# Patient Record
Sex: Female | Born: 1965 | Race: White | Hispanic: No | Smoking: Former smoker
Health system: Southern US, Community
[De-identification: ages and names within clinical notes are randomized; demographics above are authoritative.]

## PROBLEM LIST (undated history)

## (undated) DIAGNOSIS — Q2521 Interruption of aortic arch: Secondary | ICD-10-CM

## (undated) DIAGNOSIS — M810 Age-related osteoporosis without current pathological fracture: Secondary | ICD-10-CM

## (undated) HISTORY — PX: WISDOM TOOTH EXTRACTION: SHX21

## (undated) HISTORY — DX: Age-related osteoporosis without current pathological fracture: M81.0

## (undated) HISTORY — DX: Interruption of aortic arch: Q25.21

---
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
## 2001-10-03 ENCOUNTER — Other Ambulatory Visit: Admission: RE | Admit: 2001-10-03 | Discharge: 2001-10-03 | Payer: Self-pay | Admitting: *Deleted

## 2001-10-27 ENCOUNTER — Inpatient Hospital Stay (HOSPITAL_COMMUNITY): Admission: EM | Admit: 2001-10-27 | Discharge: 2001-10-29 | Payer: Self-pay | Admitting: Emergency Medicine

## 2002-10-12 ENCOUNTER — Other Ambulatory Visit: Admission: RE | Admit: 2002-10-12 | Discharge: 2002-10-12 | Payer: Self-pay | Admitting: Obstetrics and Gynecology

## 2003-10-13 ENCOUNTER — Other Ambulatory Visit: Admission: RE | Admit: 2003-10-13 | Discharge: 2003-10-13 | Payer: Self-pay | Admitting: Gynecology

## 2005-03-08 ENCOUNTER — Other Ambulatory Visit: Admission: RE | Admit: 2005-03-08 | Discharge: 2005-03-08 | Payer: Self-pay | Admitting: Gynecology

## 2006-05-15 ENCOUNTER — Other Ambulatory Visit: Admission: RE | Admit: 2006-05-15 | Discharge: 2006-05-15 | Payer: Self-pay | Admitting: Gynecology

## 2007-05-19 ENCOUNTER — Other Ambulatory Visit: Admission: RE | Admit: 2007-05-19 | Discharge: 2007-05-19 | Payer: Self-pay | Admitting: Gynecology

## 2008-07-28 ENCOUNTER — Other Ambulatory Visit: Admission: RE | Admit: 2008-07-28 | Discharge: 2008-07-28 | Payer: Self-pay | Admitting: Gynecology

## 2008-07-28 ENCOUNTER — Ambulatory Visit: Payer: Self-pay | Admitting: Women's Health

## 2008-07-28 ENCOUNTER — Encounter: Payer: Self-pay | Admitting: Women's Health

## 2010-02-10 ENCOUNTER — Other Ambulatory Visit: Admission: RE | Admit: 2010-02-10 | Discharge: 2010-02-10 | Payer: Self-pay | Admitting: Gynecology

## 2010-02-10 ENCOUNTER — Ambulatory Visit: Payer: Self-pay | Admitting: Women's Health

## 2010-10-20 NOTE — H&P (Signed)
[facility]  Patient:    Andrea Schroeder, Andrea Schroeder Visit Number: [id number] MRN: [id number]          Service Type: MED Location: 3W [id number] 01 Attending Physician:  Gaspar Garbe Dictated by:   Gaspar Garbe, M.D. Admit Date:  10/27/2001                           History and Physical  PRIMARY PHYSICIAN:  Unassigned.  CHIEF COMPLAINT:  Suicide attempt.  HISTORY OF PRESENT ILLNESS:  The patient is a 45 year old white female with no significant medical history who was at home today and found by her boyfriend to have attempted to overdose by taking approximately 40 pills of Tylenol PM. She had taken them a half-an-hour prior to being discovered.  The boyfriend noted that she was somewhat tired-appearing and found an empty pill bottle on the counter.  The patient did not make any attempt to notify him of her attempt at the time of the attempt.  He called Poison Control and then brought her to the emergency room where she was charcoal lavaged.  No pill fragments were found and given a loading dose of acetylcysteine after having an elevated Tylenol level.  The patient notes that she attempted this "so I would forget everything" and indicates that the time that she ingested the pills was just after 12 noon on Oct 27, 2001.  She indicates that she had a prior attempt six years ago where she tried to use alcohol to commit suicide.  The patient has seen a psychologist in the past, but never a psychiatrist.  This was for a diagnosis of ADD, during which time, she was given Wellbutrin as well as Ritalin.  She has never seen that practitioner other than that one time and did not continue to take her medication.  The boyfriend is in attendance and has indicated that family members will be with her 24 hours a day during her hospital stay.  The patient does not note any desire to make any other further attempt at this point, but does not show outward remorse for having  attempted to do so.  ALLERGIES:  No known drug allergies.  MEDICATIONS:  None.  PAST MEDICAL HISTORY: 1. 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  It may be necessary to    note that if she requires an NG tube and the tube seems to follow a    different path, that she may have full situs inversus. 2 ADD, diagnosed x1, but never pursued.  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  SOCIAL HISTORY:  The patient lives in [place] by herself.  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  Is single without children.  Nonsmoker and rarely a drinker.  Denies any drugs or herbal medicine use and does not indicate that she is currently using [employer] at the time.  FAMILY HISTORY:  Mother is alive and has a history of "racing heart beat." Father died possibly of lung cancer and the patient does not seem to know particulars.  She has two sisters, one with a history of depression who is not on medication.  She notes no other psychological or psychiatric diagnoses within her family.  REVIEW OF SYSTEMS:  The patient with some nausea following  administration of charcoal and the first dose of Mucomyst.  Denies any other complaints other than some fatigue and her obvious depression.  Other systems are negative.  ADVANCE DIRECTIVE:  The patient is a full code.  PHYSICAL EXAMINATION:  VITAL SIGNS:  Temperature 99.0, pulse 101, respiratory rate 20, and blood pressure 102/67.  Pain is 0 out of 10.  She is saturating 100% on room air.  GENERAL:  In no acute distress.  HEENT:  Normocephalic and atraumatic.  PERRLA.  EOMI.  ENT is within normal limits except for purplish discoloration secondary to charcoal around her lips.  NECK:  Supple, no lymphadenopathy, JVD, or bruit.  HEART:  Regular rate and rhythm.  No murmur, rub, or  gallop.  Pulses are 2+ bilaterally.  LUNGS:  Clear to auscultation bilaterally.  ABDOMEN:  Soft and nontender, normoactive bowel sounds.  No hepatosplenomegaly.  EXTREMITIES:  No clubbing, cyanosis, or edema or rashes are noted.  NEUROLOGIC:  Oriented x3.  Cranial nerves II-XII are intact.  She has 1+ deep tendon reflexes and strength 5/5 bilaterally.  LABORATORY DATA:  EKG showed initial rate of 123 with a P-R of 150, QRS of 92, and a QTC at 458.  She has slightly flattened Ts throughout the precordial leads, but no acute changes are noted.  Labs with white count 10.6, hemoglobin 12.9, hematocrit 37.7, platelets 485. BUN and creatinine are 16 and 1.0.  Her AST is 21, ALT 17, alkaline phosphatase 28.  There is no evidence of anion gap.  Tylenol level is 154.2. PT of 12.9 with an INR of 0.9 and a PTT 27, all normal.  She had a salicylate level which is negative and a tox-screen which tested positive for tricyclics which the patient vehemently denied.  ASSESSMENT: 1. Tylenol overdose.  The patient took 40 tablets of Tylenol PM.  She was    given 140 g/kg of Mucomyst in the emergency room and we will continue this    at 70 g/kg q.4h. for at least a 24 hour period, at which time, after which    we will check a Tylenol level, coags, and liver function tests.  If these    are considered normal, then the patient is medically cleared for Tylenol    overdose. 2. Prolonged QT.  Question if any changes in the EKG are related to the    antihistamine effect of the PM portion of Tylenol PM, which is very much    like Benadryl.  Will continue to watch her on telemetry and repeat an EKG    in the morning.  The lab indicated that tricyclics and these type of    medications do not cross react.  However, it is a holiday, and they did not    seem to want to look up the information to be certain.  I will make a    further attempt with the laboratory during normal business hours tomorrow. 3. Suicidal  ideation.  The patient has obviously attempted suicide and does     not show immediate remorse.  I called the Behavioral Health assessment team    and asked that she be assessed tonight.  The message was passed on to    Dr. Jeanie Sewer who will be handling her from a psychiatric nature with the    option being commitment, as I am not certain whether the patient would    want to stay for the duration of her treatment which could obviously cause    her  to further harm herself.  She may require commitment because of the    above.  We will follow their lead on psychiatric management and turn over    her care to them once she has been cleared from a medical standpoint. Dictated by:   Gaspar Garbe, M.D. Attending Physician:  Gaspar Garbe DD:  10/27/01 TD:  10/29/01 Job: [id number]

## 2010-10-20 NOTE — Discharge Summary (Signed)
[facility]  Patient:    Andrea Schroeder, EARLY Visit Number: [id number] MRN: [id number]          Service Type: MED Location: 3W 0364 01 Attending Physician:  Gaspar Garbe Dictated by:   Gaspar Garbe, M.D. Admit Date:  10/27/2001 Discharge Date: 10/29/2001                             Discharge Summary  DISCHARGE DIAGNOSES: 1. Tylenol ingestion, acute. 2. Suicidal ideation. 3. Positive tricyclic screen.  HOSPITAL COURSE:  The patient was admitted on Oct 27, 2001, from the emergency room after having taking approximately 40 Tylenol P.M.  She underwent charcoal lavage with no pill fragments being found and was started on Mucomyst with a loading dose in the emergency room.  She was followed for a period of greater than 24 hours after which her coagulations were negative.  Her Tylenol level was less than 10 and her liver function tests were found to be normal.  She was receiving Mucomyst every four hours.  The patient was seen by psychiatry and was found to be not committable.  She was offered a day program for her suicidal ideation at [facility] with an appointment at 1 p.m. on Oct 29, 2001.  The patient indicated that she was not going to every try this again and was grateful for the help given her.  DISCHARGE MEDICATIONS:  None.  FOLLOWUP:  Follow up as above. Dictated by:   Gaspar Garbe, M.D. Attending Physician:  Gaspar Garbe DD:  10/30/01 TD:  11/01/01 Job: [id number]

## 2010-11-13 ENCOUNTER — Emergency Department (HOSPITAL_COMMUNITY)
Admission: EM | Admit: 2010-11-13 | Discharge: 2010-11-13 | Disposition: A | Discharge status: Home / Self Care | Payer: 59 | Attending: Emergency Medicine | Admitting: Emergency Medicine

## 2010-11-13 ENCOUNTER — Emergency Department (HOSPITAL_COMMUNITY): Payer: 59

## 2010-11-13 DIAGNOSIS — R51 Headache: Secondary | ICD-10-CM | POA: Insufficient documentation

## 2010-11-13 DIAGNOSIS — R55 Syncope and collapse: Secondary | ICD-10-CM | POA: Insufficient documentation

## 2010-11-13 LAB — CBC
Hemoglobin: 12.8 g/dL (ref 12.0–15.0)
MCH: 29.6 pg (ref 26.0–34.0)
Platelets: 440 10*3/uL — ABNORMAL HIGH (ref 150–400)
RBC: 4.32 MIL/uL (ref 3.87–5.11)
WBC: 16.3 10*3/uL — ABNORMAL HIGH (ref 4.0–10.5)

## 2010-11-13 LAB — URINE MICROSCOPIC-ADD ON

## 2010-11-13 LAB — BASIC METABOLIC PANEL
CO2: 25 mEq/L (ref 19–32)
Calcium: 9 mg/dL (ref 8.4–10.5)
Chloride: 97 mEq/L (ref 96–112)
Glucose, Bld: 98 mg/dL (ref 70–99)
Sodium: 132 mEq/L — ABNORMAL LOW (ref 135–145)

## 2010-11-13 LAB — URINALYSIS, ROUTINE W REFLEX MICROSCOPIC
Ketones, ur: NEGATIVE mg/dL
Leukocytes, UA: NEGATIVE
Protein, ur: 30 mg/dL — AB
Urobilinogen, UA: 0.2 mg/dL (ref 0.0–1.0)

## 2010-11-13 LAB — DIFFERENTIAL
Basophils Relative: 0 % (ref 0–1)
Lymphs Abs: 2 10*3/uL (ref 0.7–4.0)
Monocytes Relative: 8 % (ref 3–12)
Neutro Abs: 12.9 10*3/uL — ABNORMAL HIGH (ref 1.7–7.7)
Neutrophils Relative %: 79 % — ABNORMAL HIGH (ref 43–77)

## 2010-11-13 LAB — D-DIMER, QUANTITATIVE: D-Dimer, Quant: <0.22 ug/mL-FEU (ref 0.00–0.48)

## 2010-11-13 LAB — RAPID URINE DRUG SCREEN, HOSP PERFORMED
Amphetamines: NOT DETECTED
Barbiturates: NOT DETECTED
Tetrahydrocannabinol: NOT DETECTED

## 2011-02-08 DIAGNOSIS — Q2521 Interruption of aortic arch: Secondary | ICD-10-CM | POA: Insufficient documentation

## 2011-02-08 DIAGNOSIS — B2790 Infectious mononucleosis, unspecified without complication: Secondary | ICD-10-CM | POA: Insufficient documentation

## 2011-02-15 ENCOUNTER — Encounter: Payer: Self-pay | Admitting: Women's Health

## 2011-02-15 ENCOUNTER — Ambulatory Visit (INDEPENDENT_AMBULATORY_CARE_PROVIDER_SITE_OTHER): Payer: 59 | Admitting: Women's Health

## 2011-02-15 ENCOUNTER — Other Ambulatory Visit (HOSPITAL_COMMUNITY)
Admission: RE | Admit: 2011-02-15 | Discharge: 2011-02-15 | Disposition: A | Discharge status: Home / Self Care | Payer: 59 | Source: Ambulatory Visit | Attending: Obstetrics and Gynecology | Admitting: Obstetrics and Gynecology

## 2011-02-15 VITALS — BP 112/70 | Ht 65.5 in | Wt 167.0 lb

## 2011-02-15 DIAGNOSIS — Z01419 Encounter for gynecological examination (general) (routine) without abnormal findings: Secondary | ICD-10-CM | POA: Insufficient documentation

## 2011-02-15 DIAGNOSIS — N870 Mild cervical dysplasia: Secondary | ICD-10-CM | POA: Insufficient documentation

## 2011-02-15 NOTE — Progress Notes (Signed)
Andrea Schroeder [birth year] [id number]    History:    The patient presents for annual exam.  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   Past medical history, past surgical history, family history and social history were all reviewed and documented in the EPIC chart.   ROS:  A  ROS was performed and pertinent positives and negatives are included in the history.  Exam:  Filed Vitals:   02/15/11 1632  BP: 112/70    General appearance:  Normal Head/Neck:  Normal, without cervical or supraclavicular adenopathy. Thyroid:  Symmetrical, normal in size, without palpable masses or nodularity. Respiratory  Effort:  Normal  Auscultation:  Clear without wheezing or rhonchi Cardiovascular  Auscultation:  Regular rate, without rubs, murmurs or gallops  Edema/varicosities:  Not grossly evident Abdominal  Soft,nontender, without masses, guarding or rebound.  Liver/spleen:  No organomegaly noted  Hernia:  None appreciated  Skin  Inspection:  Grossly normal  Palpation:  Grossly normal Neurologic/psychiatric  Orientation:  Normal with appropriate conversation.  Mood/affect:  Normal  Genitourinary    Breasts: Examined lying and sitting.     Right: Without masses, retractions, discharge or axillary adenopathy.     Left: Without masses, retractions, discharge or axillary adenopathy.   Inguinal/mons:  Normal without inguinal adenopathy  External genitalia:  Normal  BUS/Urethra/Skene's glands:  Normal  Bladder:  Normal  Vagina:  Normal  Cervix:  normal  Uterus:   normal in size, shape and contour.  Midline and mobile  Adnexa/parametria:     Rt: Without masses or tenderness.   Lt: Without masses or tenderness.  Anus and perineum: Normal  Digital rectal exam: Normal sphincter tone without palpated masses or tenderness  Assessment/Plan:  45 y.o. SEF G0 for annual exam.  Monthly 4-5 day cycle, using no contraception, states both she and her partner travel a lot  and declines contraception, states pregnancy would be fine. SBEs, 6 month mammogram followup she will get scheduled, has had some problems with fibrocystic/dense breast tissue. Calcium rich diet,  multivitamin daily, exercise encouraged. Is not planning on pregnancy at this time, and did review if she chooses to be, pregnant best not to wait do to her age. She had a syncopal episode at her gym while exercising in June, had a normal cardiac workup and CT scan of her head. No cause was noted. Pap, UA only today.    Harrington Challenger [facility], 5:03 PM 02/15/2011

## 2011-07-13 IMAGING — CT CT HEAD W/O CM
1 series · 16 of 30 positions shown, 20 images · non-contrast
Comparison: None.

CLINICAL DATA: Syncope.  Posterior headaches.

CT HEAD WITHOUT CONTRAST
TECHNIQUE: Contiguous axial images were obtained from the base of
the skull through the vertex without contrast.

[Series 2: head_seq 4.5 h37s st · axial · 0.43mm/px · z∈[-127,-1]mm · 16 of 32 slices shown, 20 images]
[im 2/32  brain]
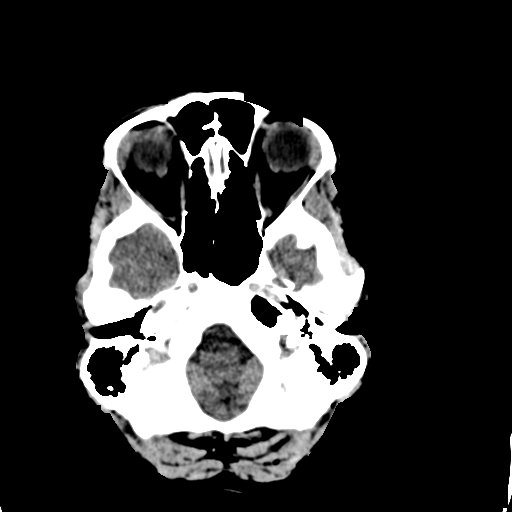
[im 2/32  bone]
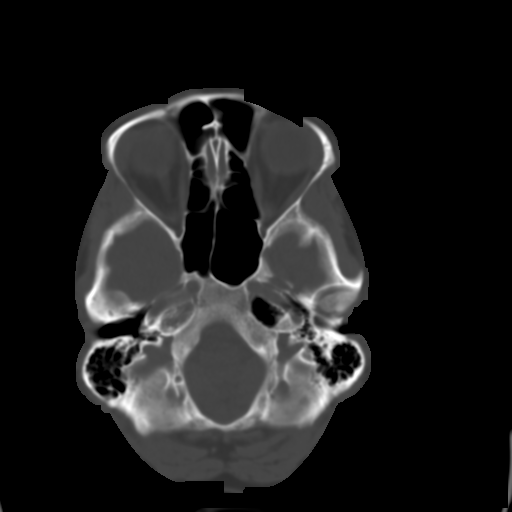
[im 4/32  brain]
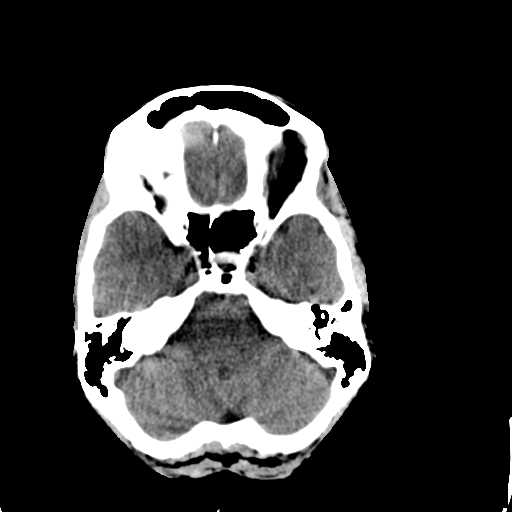
[im 6/32  brain]
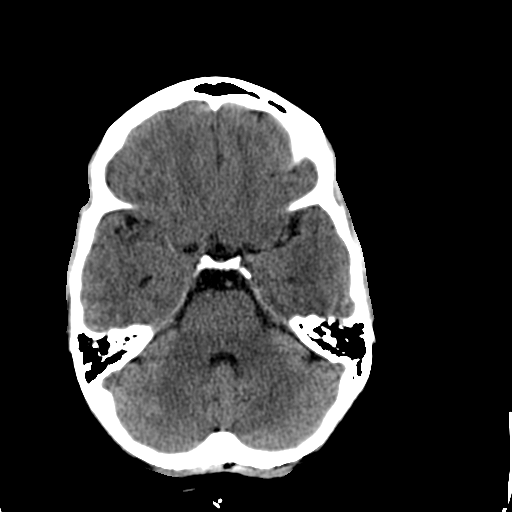
[im 8/32  brain]
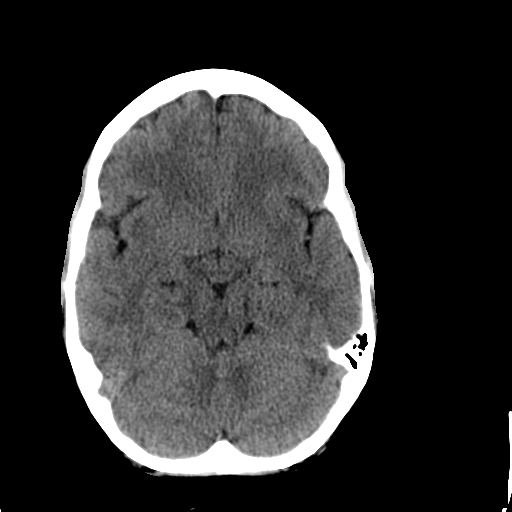
[im 9/32  brain]
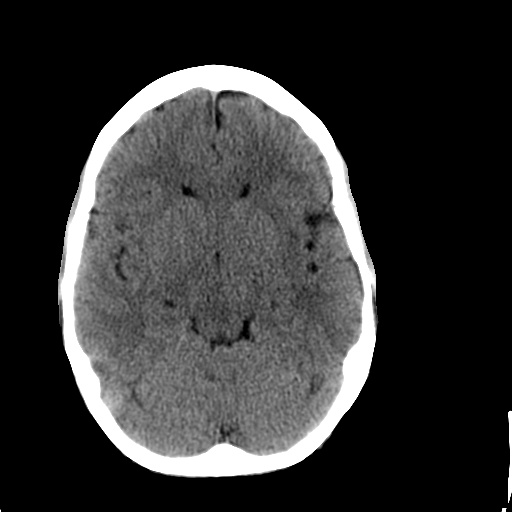
[im 9/32  bone]
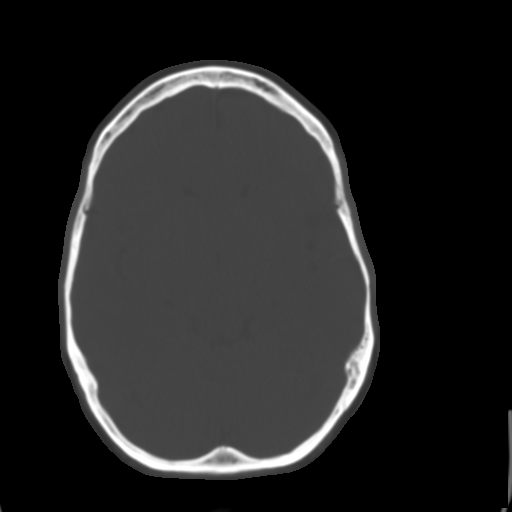
[im 11/32  brain]
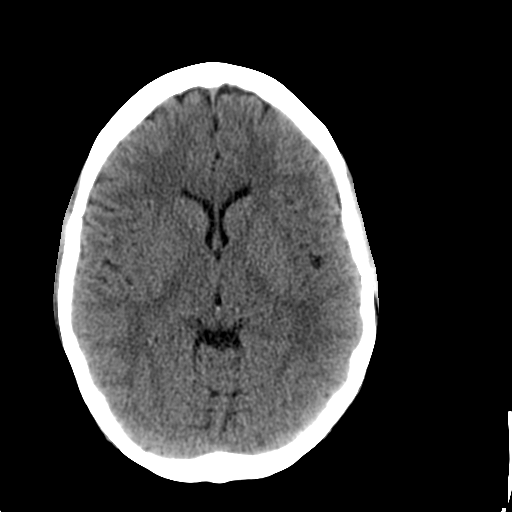
[im 13/32  brain]
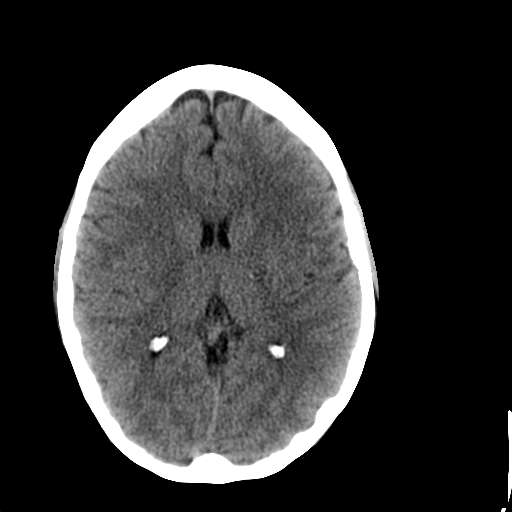
[im 15/32  brain]
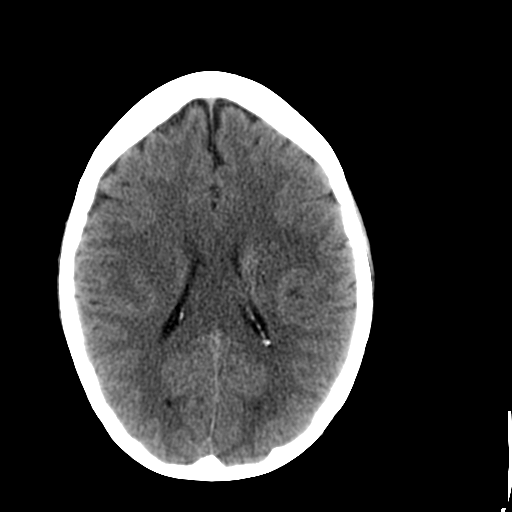
[im 17/32  brain]
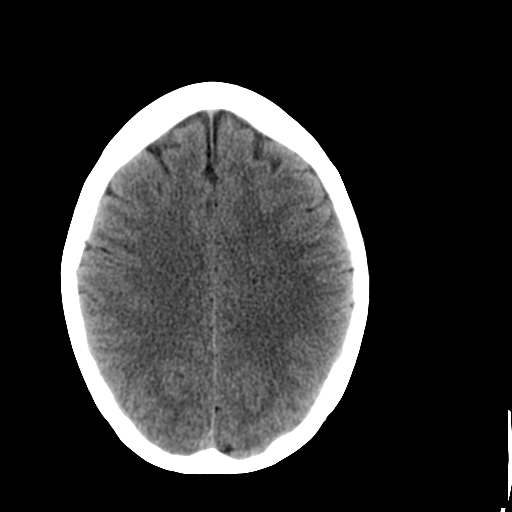
[im 17/32  bone]
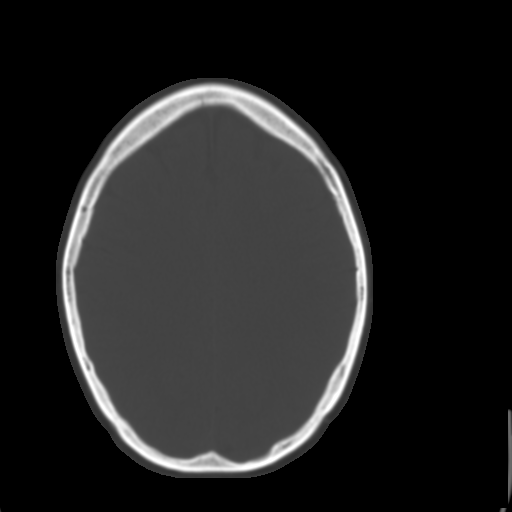
[im 19/32  brain]
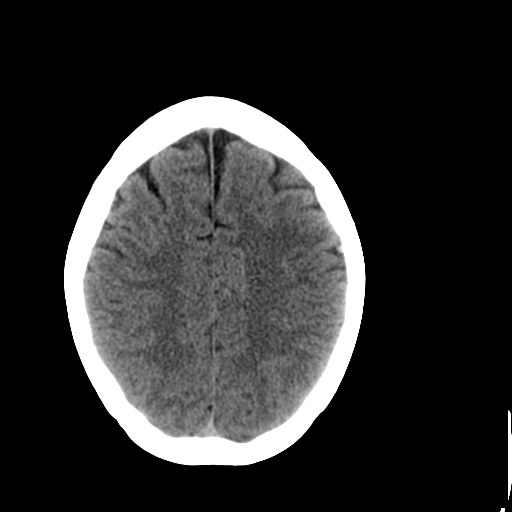
[im 21/32  brain]
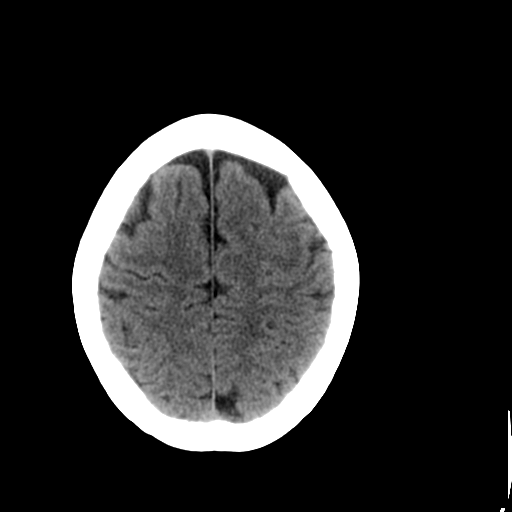
[im 23/32  brain]
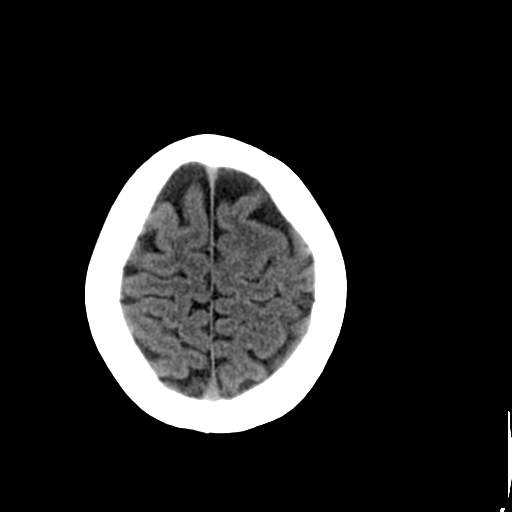
[im 24/32  brain]
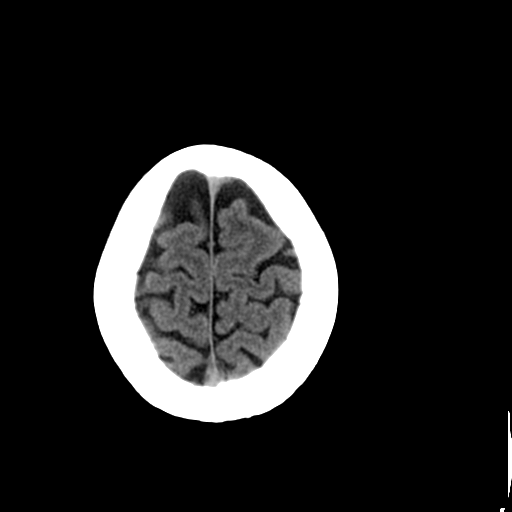
[im 24/32  bone]
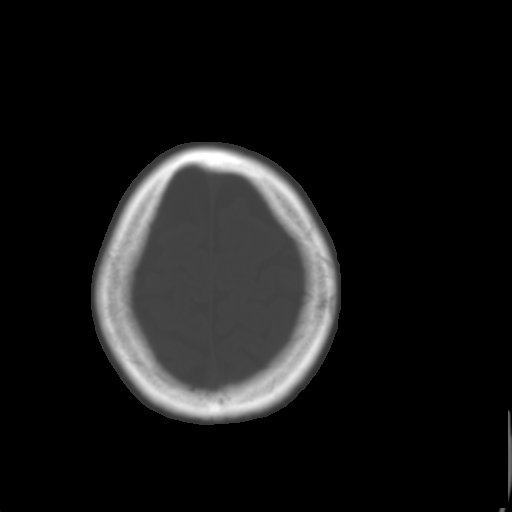
[im 26/32  brain]
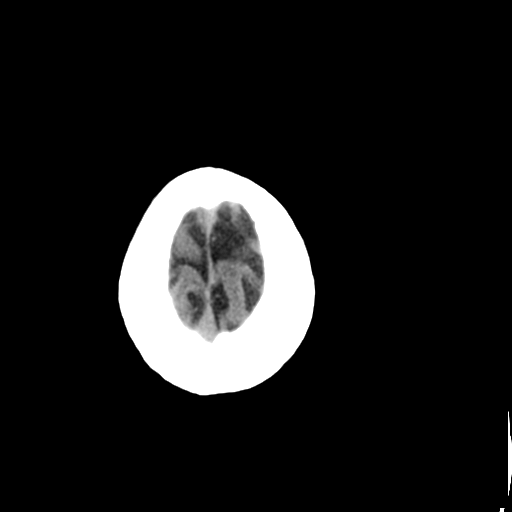
[im 28/32  brain]
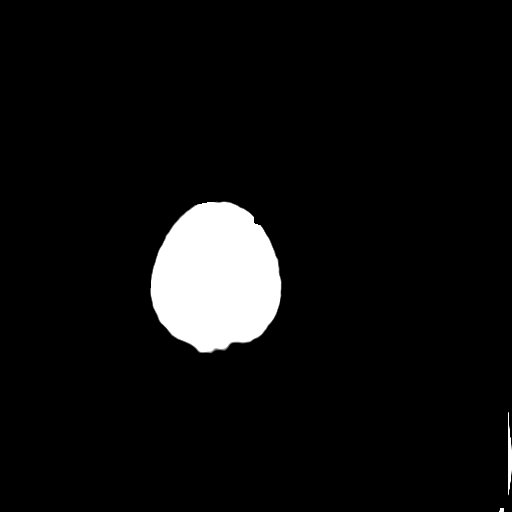
[im 30/32  brain]
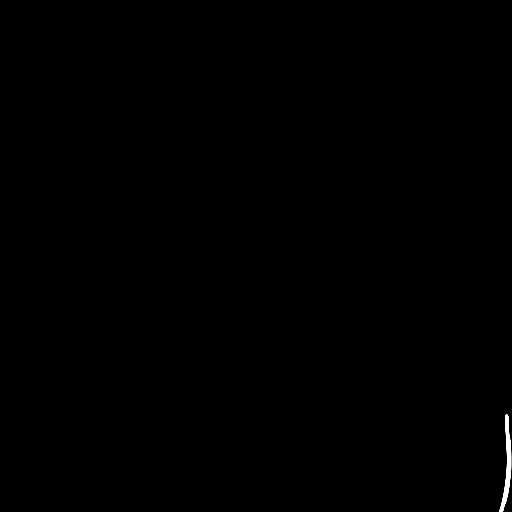

[16 of 30 positions shown; findings below may reference images not displayed]

FINDINGS: No mass lesion, mass effect, midline shift,
hydrocephalus, hemorrhage.  No territorial ischemia or acute
infarction.  Paranasal sinuses clear.
IMPRESSION: Negative CT head.

## 2012-02-18 ENCOUNTER — Encounter: Payer: Self-pay | Admitting: Women's Health

## 2012-02-19 ENCOUNTER — Encounter: Payer: Self-pay | Admitting: Women's Health

## 2012-12-11 ENCOUNTER — Encounter: Payer: Self-pay | Admitting: Women's Health

## 2012-12-19 ENCOUNTER — Encounter: Payer: Self-pay | Admitting: Women's Health

## 2012-12-19 ENCOUNTER — Ambulatory Visit (INDEPENDENT_AMBULATORY_CARE_PROVIDER_SITE_OTHER): Payer: 59 | Admitting: Women's Health

## 2012-12-19 ENCOUNTER — Other Ambulatory Visit (HOSPITAL_COMMUNITY)
Admission: RE | Admit: 2012-12-19 | Discharge: 2012-12-19 | Disposition: A | Discharge status: Home / Self Care | Payer: 59 | Source: Ambulatory Visit | Attending: Gynecology | Admitting: Gynecology

## 2012-12-19 VITALS — BP 114/72 | Ht 65.5 in | Wt 171.0 lb

## 2012-12-19 DIAGNOSIS — Z01419 Encounter for gynecological examination (general) (routine) without abnormal findings: Secondary | ICD-10-CM

## 2012-12-19 DIAGNOSIS — E079 Disorder of thyroid, unspecified: Secondary | ICD-10-CM

## 2012-12-19 DIAGNOSIS — B2790 Infectious mononucleosis, unspecified without complication: Secondary | ICD-10-CM

## 2012-12-19 DIAGNOSIS — Z13220 Encounter for screening for lipoid disorders: Secondary | ICD-10-CM

## 2012-12-19 LAB — LIPID PANEL
HDL: 65 mg/dL (ref >39)
LDL Cholesterol: 103 mg/dL — ABNORMAL HIGH (ref 0–99)
Total CHOL/HDL Ratio: 2.8 Ratio

## 2012-12-19 LAB — CBC WITH DIFFERENTIAL/PLATELET
Eosinophils Absolute: 0.2 10*3/uL (ref 0.0–0.7)
Eosinophils Relative: 2 % (ref 0–5)
HCT: 37.2 % (ref 36.0–46.0)
Hemoglobin: 12.5 g/dL (ref 12.0–15.0)
Lymphocytes Relative: 33 % (ref 12–46)
Lymphs Abs: 2.8 10*3/uL (ref 0.7–4.0)
MCH: 29.1 pg (ref 26.0–34.0)
MCV: 86.7 fL (ref 78.0–100.0)
Monocytes Absolute: 0.9 10*3/uL (ref 0.1–1.0)
Monocytes Relative: 10 % (ref 3–12)
Platelets: 491 10*3/uL — ABNORMAL HIGH (ref 150–400)
RBC: 4.29 MIL/uL (ref 3.87–5.11)
WBC: 8.4 10*3/uL (ref 4.0–10.5)

## 2012-12-19 NOTE — Patient Instructions (Signed)

## 2012-12-19 NOTE — Progress Notes (Signed)
Andrea Schroeder 04-12-1966 [id number]    History:    The patient presents for annual exam.  Monthly cycle using no contraception both she and her husband travel, in frequent intercourse. Not sure desiring pregnancy. Normal mammograms. 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 Struggles with weight, exercising most days, eating a healthy diet.  Past medical history, past surgical history, family history and social history were all reviewed and documented in the EPIC chart. 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 Splenectomy 1985 from [place], thrombocytopenia after. Father died from lung cancer. Mother hypertension.  ROS:  A  ROS was performed and pertinent positives and negatives are included in the history.  Exam:  Filed Vitals:   12/19/12 1615  BP: 114/72    General appearance:  Normal Head/Neck:  Normal, without cervical or supraclavicular adenopathy. Thyroid:  Symmetrical, normal in size, without palpable masses or nodularity. Respiratory  Effort:  Normal  Auscultation:  Clear without wheezing or rhonchi Cardiovascular  Auscultation:  Regular rate, without rubs, murmurs or gallops  Edema/varicosities:  Not grossly evident Abdominal  Soft,nontender, without masses, guarding or rebound.  Liver/spleen:  No organomegaly noted  Hernia:  None appreciated  Skin  Inspection:  Grossly normal  Palpation:  Grossly normal Neurologic/psychiatric  Orientation:  Normal with appropriate conversation.  Mood/affect:  Normal  Genitourinary    Breasts: Examined lying and sitting.     Right: Without masses, retractions, discharge or axillary adenopathy.     Left: Without masses, retractions, discharge or axillary adenopathy.   Inguinal/mons:  Normal without inguinal adenopathy  External genitalia:  Normal  BUS/Urethra/Skene's glands:  Normal  Bladder:  Normal  Vagina:  Normal  Cervix:  Normal  Uterus:   normal in size, shape and contour.  Midline and mobile  Adnexa/parametria:     Rt: Without  masses or tenderness.   Lt: Without masses or tenderness.  Anus and perineum: Normal  Digital rectal exam: Normal sphincter tone without palpated masses or tenderness  Assessment/Plan:  46 y.o.M. WF G0  for annual exam.    LGSIL 92 normal Paps after Monthly cycle using no contraception  Plan: Contraception reviewed declines if pregnancy occurs will be happy. Healthy pregnancy behaviors reviewed, continue prenatal vitamin daily, SBE's, continue annual mammogram, breast noted to be dense, reviewed 3-D tomography. Continue healthy diet and regular exercise. CBC, TSH, lipid panel, UA, Pap. Pap normal 2012, new screening guidelines reviewed.    Harrington Challenger [facility], 5:07 PM 12/19/2012

## 2012-12-20 LAB — URINALYSIS W MICROSCOPIC + REFLEX CULTURE
Casts: NONE SEEN
Crystals: NONE SEEN
Glucose, UA: NEGATIVE mg/dL
Hgb urine dipstick: NEGATIVE
Ketones, ur: NEGATIVE mg/dL
Nitrite: NEGATIVE
Specific Gravity, Urine: 1.014 (ref 1.005–1.030)
pH: 7 (ref 5.0–8.0)

## 2012-12-20 LAB — TSH: TSH: 1.724 u[IU]/mL (ref 0.350–4.500)

## 2013-04-03 ENCOUNTER — Encounter: Payer: Self-pay | Admitting: Women's Health

## 2013-06-04 DIAGNOSIS — R7303 Prediabetes: Secondary | ICD-10-CM

## 2013-06-04 HISTORY — DX: Prediabetes: R73.03

## 2013-12-22 ENCOUNTER — Other Ambulatory Visit (HOSPITAL_COMMUNITY)
Admission: RE | Admit: 2013-12-22 | Discharge: 2013-12-22 | Disposition: A | Discharge status: Home / Self Care | Payer: 59 | Source: Ambulatory Visit | Attending: Women's Health | Admitting: Women's Health

## 2013-12-22 ENCOUNTER — Encounter: Payer: Self-pay | Admitting: Women's Health

## 2013-12-22 ENCOUNTER — Other Ambulatory Visit: Payer: Self-pay | Admitting: Women's Health

## 2013-12-22 ENCOUNTER — Ambulatory Visit (INDEPENDENT_AMBULATORY_CARE_PROVIDER_SITE_OTHER): Payer: 59 | Admitting: Women's Health

## 2013-12-22 VITALS — BP 100/68 | Ht 65.0 in | Wt 177.0 lb

## 2013-12-22 DIAGNOSIS — Z13220 Encounter for screening for lipoid disorders: Secondary | ICD-10-CM

## 2013-12-22 DIAGNOSIS — Z01419 Encounter for gynecological examination (general) (routine) without abnormal findings: Secondary | ICD-10-CM | POA: Insufficient documentation

## 2013-12-22 DIAGNOSIS — E079 Disorder of thyroid, unspecified: Secondary | ICD-10-CM

## 2013-12-22 DIAGNOSIS — Z1151 Encounter for screening for human papillomavirus (HPV): Secondary | ICD-10-CM | POA: Insufficient documentation

## 2013-12-22 LAB — CBC WITH DIFFERENTIAL/PLATELET
BASOS PCT: 1 % (ref 0–1)
Basophils Absolute: 0.1 10*3/uL (ref 0.0–0.1)
EOS ABS: 0.2 10*3/uL (ref 0.0–0.7)
EOS PCT: 2 % (ref 0–5)
HCT: 35.5 % — ABNORMAL LOW (ref 36.0–46.0)
HEMOGLOBIN: 12 g/dL (ref 12.0–15.0)
Lymphocytes Relative: 32 % (ref 12–46)
Lymphs Abs: 2.7 10*3/uL (ref 0.7–4.0)
MCH: 29.3 pg (ref 26.0–34.0)
MCHC: 33.8 g/dL (ref 30.0–36.0)
MCV: 86.8 fL (ref 78.0–100.0)
MONO ABS: 0.8 10*3/uL (ref 0.1–1.0)
MONOS PCT: 9 % (ref 3–12)
NEUTROS PCT: 56 % (ref 43–77)
Neutro Abs: 4.7 10*3/uL (ref 1.7–7.7)
Platelets: 533 10*3/uL — ABNORMAL HIGH (ref 150–400)
RBC: 4.09 MIL/uL (ref 3.87–5.11)
RDW: 14.9 % (ref 11.5–15.5)
WBC: 8.4 10*3/uL (ref 4.0–10.5)

## 2013-12-22 MED ORDER — ESZOPICLONE 2 MG PO TABS: 2.0000 mg | ORAL_TABLET | Freq: Every evening | ORAL | Status: DC | PRN

## 2013-12-22 NOTE — Addendum Note (Signed)
Addended by: Richardson ChiquitoWILKINSON, KARI S on: 12/22/2013 05:02 PM   Modules accepted: Orders

## 2013-12-22 NOTE — Patient Instructions (Signed)

## 2013-12-22 NOTE — Progress Notes (Signed)
Edd ArbourKelli Lodwick [birth year] [id number]    History:    Presents for annual exam.  Monthly cycle, using no contraception, pregnancy good, declines intervention. Both she and her husband travel with their jobs. Normal mammogram history. 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  Past medical history, past surgical history, family history and social history were all reviewed and documented in the EPIC chart. 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 Has 2 dogs.  ROS:  A  12 point ROS was performed and pertinent positives and negatives are included.  Exam:  Filed Vitals:   12/22/13 1555  BP: 100/68    General appearance:  Normal Thyroid:  Symmetrical, normal in size, without palpable masses or nodularity. Respiratory  Auscultation:  Clear without wheezing or rhonchi Cardiovascular  Auscultation:  Regular rate, without rubs, murmurs or gallops  Edema/varicosities:  Not grossly evident Abdominal  Soft,nontender, without masses, guarding or rebound.  Liver/spleen:  No organomegaly noted  Hernia:  None appreciated  Skin  Inspection:  Grossly normal   Breasts: Examined lying and sitting.     Right: Without masses, retractions, discharge or axillary adenopathy.     Left: Without masses, retractions, discharge or axillary adenopathy. Gentitourinary   Inguinal/mons:  Normal without inguinal adenopathy  External genitalia:  Normal  BUS/Urethra/Skene's glands:  Normal  Vagina:  Normal  Cervix:  Normal  Uterus:   normal in size, shape and contour.  Midline and mobile  Adnexa/parametria:     Rt: Without masses or tenderness.   Lt: Without masses or tenderness.  Anus and perineum: Normal  Digital rectal exam: Normal sphincter tone without palpated masses or tenderness  Assessment/Plan:  48 y.o. MWF G0 for annual exam but occasional indigestion and struggling to lose weight in spite of exercise and diet.  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 Declines intervention for infertility. Continue regular exercise, calcium rich diet, prenatal vitamin daily. CBC, comprehensive metabolic panel, lipid panel, TSH, UA, HR HPV typing. New Pap screening guidelines reviewed.    Note: This dictation was prepared with Dragon/digital dictation.  Any transcriptional errors that result are unintentional. Harrington ChallengerYOUNG,NANCY J Gulf Coast Surgical CenterWHNP, 4:39 PM 12/22/2013

## 2013-12-23 LAB — COMPREHENSIVE METABOLIC PANEL
ALK PHOS: 45 U/L (ref 39–117)
ALT: 13 U/L (ref 0–35)
AST: 15 U/L (ref 0–37)
Albumin: 4 g/dL (ref 3.5–5.2)
BILIRUBIN TOTAL: 0.4 mg/dL (ref 0.2–1.2)
BUN: 20 mg/dL (ref 6–23)
CO2: 22 mEq/L (ref 19–32)
CREATININE: 0.77 mg/dL (ref 0.50–1.10)
Calcium: 9.2 mg/dL (ref 8.4–10.5)
Chloride: 106 mEq/L (ref 96–112)
GLUCOSE: 112 mg/dL — AB (ref 70–99)
Potassium: 3.8 mEq/L (ref 3.5–5.3)
SODIUM: 138 meq/L (ref 135–145)
Total Protein: 7 g/dL (ref 6.0–8.3)

## 2013-12-23 LAB — LIPID PANEL
CHOL/HDL RATIO: 3 ratio
CHOLESTEROL: 175 mg/dL (ref 0–200)
HDL: 58 mg/dL (ref >39)
LDL CALC: 92 mg/dL (ref 0–99)
TRIGLYCERIDES: 126 mg/dL (ref <150)
VLDL: 25 mg/dL (ref 0–40)

## 2013-12-23 LAB — TSH: TSH: 0.992 u[IU]/mL (ref 0.350–4.500)

## 2013-12-24 ENCOUNTER — Telehealth: Payer: Self-pay

## 2013-12-24 ENCOUNTER — Other Ambulatory Visit: Payer: Self-pay | Admitting: Women's Health

## 2013-12-24 DIAGNOSIS — R739 Hyperglycemia, unspecified: Secondary | ICD-10-CM

## 2013-12-24 LAB — HEMOGLOBIN A1C
Hgb A1c MFr Bld: 5.7 % — ABNORMAL HIGH (ref <5.7)
Mean Plasma Glucose: 117 mg/dL — ABNORMAL HIGH (ref <117)

## 2013-12-24 LAB — CYTOLOGY - PAP

## 2013-12-24 NOTE — Telephone Encounter (Signed)
Telephone call, encourage Weight [employer] or [employer] diet for a low carb diet, we'll recheck hemoglobin A1c in 3-4 months after dietary changes. Continue healthy diet and exercise as she is doing.

## 2013-12-24 NOTE — Telephone Encounter (Signed)
Patient is trying to lose weight and friend of her's is taking DHEA.  She wondered if you knew about this and had an opinion as to whether she should take it or not.

## 2014-02-22 ENCOUNTER — Other Ambulatory Visit: Payer: Self-pay | Admitting: Women's Health

## 2014-02-22 ENCOUNTER — Telehealth: Payer: Self-pay | Admitting: *Deleted

## 2014-02-22 MED ORDER — ALPRAZOLAM 0.25 MG PO TABS: 0.2500 mg | ORAL_TABLET | Freq: Every evening | ORAL | Status: DC | PRN

## 2014-02-22 NOTE — Telephone Encounter (Signed)
TC states overwhelmed with work and dealing with mother and health problems, poor sleep and emotional, xanax .25 rx called in, aware to use sparingly, denies need for counseling at this time, situation.

## 2014-02-22 NOTE — Telephone Encounter (Signed)
(  pt aware you are out of the office) Pt was seen for annual in July states you spoke with her about taking a low dose of valium due to stress. Pt said she having stress at work and her mother c.o trouble with focusing, cries at times. Pt asked if this is an option still? OV? Please advise

## 2014-05-11 ENCOUNTER — Encounter: Payer: Self-pay | Admitting: Women's Health

## 2014-12-28 ENCOUNTER — Ambulatory Visit (INDEPENDENT_AMBULATORY_CARE_PROVIDER_SITE_OTHER): Payer: 59 | Admitting: Women's Health

## 2014-12-28 ENCOUNTER — Encounter: Payer: Self-pay | Admitting: Women's Health

## 2014-12-28 VITALS — BP 124/80 | Ht 65.0 in | Wt 172.0 lb

## 2014-12-28 DIAGNOSIS — N870 Mild cervical dysplasia: Secondary | ICD-10-CM

## 2014-12-28 DIAGNOSIS — Z13220 Encounter for screening for lipoid disorders: Secondary | ICD-10-CM | POA: Diagnosis not present

## 2014-12-28 DIAGNOSIS — Z01419 Encounter for gynecological examination (general) (routine) without abnormal findings: Secondary | ICD-10-CM

## 2014-12-28 DIAGNOSIS — B2790 Infectious mononucleosis, unspecified without complication: Secondary | ICD-10-CM

## 2014-12-28 LAB — COMPREHENSIVE METABOLIC PANEL
ALBUMIN: 4.3 g/dL (ref 3.6–5.1)
ALK PHOS: 49 U/L (ref 33–115)
ALT: 12 U/L (ref 6–29)
AST: 15 U/L (ref 10–35)
BUN: 12 mg/dL (ref 7–25)
CALCIUM: 9.6 mg/dL (ref 8.6–10.2)
CHLORIDE: 102 meq/L (ref 98–110)
CO2: 23 meq/L (ref 20–31)
CREATININE: 0.86 mg/dL (ref 0.50–1.10)
GLUCOSE: 92 mg/dL (ref 65–99)
Potassium: 4.3 mEq/L (ref 3.5–5.3)
SODIUM: 136 meq/L (ref 135–146)
TOTAL PROTEIN: 7.2 g/dL (ref 6.1–8.1)
Total Bilirubin: 0.2 mg/dL (ref 0.2–1.2)

## 2014-12-28 LAB — CBC WITH DIFFERENTIAL/PLATELET
BASOS PCT: 1 % (ref 0–1)
Basophils Absolute: 0.1 10*3/uL (ref 0.0–0.1)
EOS PCT: 3 % (ref 0–5)
Eosinophils Absolute: 0.2 10*3/uL (ref 0.0–0.7)
HCT: 37 % (ref 36.0–46.0)
HEMOGLOBIN: 12 g/dL (ref 12.0–15.0)
Lymphocytes Relative: 42 % (ref 12–46)
Lymphs Abs: 2.8 10*3/uL (ref 0.7–4.0)
MCH: 28.8 pg (ref 26.0–34.0)
MCHC: 32.4 g/dL (ref 30.0–36.0)
MCV: 88.7 fL (ref 78.0–100.0)
MONO ABS: 0.6 10*3/uL (ref 0.1–1.0)
MONOS PCT: 9 % (ref 3–12)
MPV: 9.9 fL (ref 8.6–12.4)
NEUTROS ABS: 3 10*3/uL (ref 1.7–7.7)
Neutrophils Relative %: 45 % (ref 43–77)
PLATELETS: 594 10*3/uL — AB (ref 150–400)
RBC: 4.17 MIL/uL (ref 3.87–5.11)
RDW: 14.2 % (ref 11.5–15.5)
WBC: 6.7 10*3/uL (ref 4.0–10.5)

## 2014-12-28 LAB — HEMOGLOBIN A1C
Hgb A1c MFr Bld: 5.8 % — ABNORMAL HIGH (ref <5.7)
MEAN PLASMA GLUCOSE: 120 mg/dL — AB (ref <117)

## 2014-12-28 NOTE — Patient Instructions (Signed)
Health Recommendations for Postmenopausal Women Respected and ongoing research has looked at the most common causes of death, disability, and poor quality of life in postmenopausal women. The causes include heart disease, diseases of blood vessels, diabetes, depression, cancer, and bone loss (osteoporosis). Many things can be done to help lower the chances of developing these and other common problems. CARDIOVASCULAR DISEASE Heart Disease: A heart attack is a medical emergency. Know the signs and symptoms of a heart attack. Below are things women can do to reduce their risk for heart disease.   Do not smoke. If you smoke, quit.  Aim for a healthy weight. Being overweight causes many preventable deaths. Eat a healthy and balanced diet and drink an adequate amount of liquids.  Get moving. Make a commitment to be more physically active. Aim for 30 minutes of activity on most, if not all days of the week.  Eat for heart health. Choose a diet that is low in saturated fat and cholesterol and eliminate trans fat. Include whole grains, vegetables, and fruits. Read and understand the labels on food containers before buying.  Know your numbers. Ask your caregiver to check your blood pressure, cholesterol (total, HDL, LDL, triglycerides) and blood glucose. Work with your caregiver on improving your entire clinical picture.  High blood pressure. Limit or stop your table salt intake (try salt substitute and food seasonings). Avoid salty foods and drinks. Read labels on food containers before buying. Eating well and exercising can help control high blood pressure. STROKE  Stroke is a medical emergency. Stroke may be the result of a blood clot in a blood vessel in the brain or by a brain hemorrhage (bleeding). Know the signs and symptoms of a stroke. To lower the risk of developing a stroke:  Avoid fatty foods.  Quit smoking.  Control your diabetes, blood pressure, and irregular heart rate. THROMBOPHLEBITIS  (BLOOD CLOT) OF THE LEG  Becoming overweight and leading a stationary lifestyle may also contribute to developing blood clots. Controlling your diet and exercising will help lower the risk of developing blood clots. CANCER SCREENING  Breast Cancer: Take steps to reduce your risk of breast cancer.  You should practice "breast self-awareness." This means understanding the normal appearance and feel of your breasts and should include breast self-examination. Any changes detected, no matter how small, should be reported to your caregiver.  After age 40, you should have a clinical breast exam (CBE) every year.  Starting at age 40, you should consider having a mammogram (breast X-ray) every year.  If you have a family history of breast cancer, talk to your caregiver about genetic screening.  If you are at high risk for breast cancer, talk to your caregiver about having an MRI and a mammogram every year.  Intestinal or Stomach Cancer: Tests to consider are a rectal exam, fecal occult blood, sigmoidoscopy, and colonoscopy. Women who are high risk may need to be screened at an earlier age and more often.  Cervical Cancer:  Beginning at age 30, you should have a Pap test every 3 years as long as the past 3 Pap tests have been normal.  If you have had past treatment for cervical cancer or a condition that could lead to cancer, you need Pap tests and screening for cancer for at least 20 years after your treatment.  If you had a hysterectomy for a problem that was not cancer or a condition that could lead to cancer, then you no longer need Pap tests.    If you are between ages 65 and 70, and you have had normal Pap tests going back 10 years, you no longer need Pap tests.  If Pap tests have been discontinued, risk factors (such as a new sexual partner) need to be reassessed to determine if screening should be resumed.  Some medical problems can increase the chance of getting cervical cancer. In these  cases, your caregiver may recommend more frequent screening and Pap tests.  Uterine Cancer: If you have vaginal bleeding after reaching menopause, you should notify your caregiver.  Ovarian Cancer: Other than yearly pelvic exams, there are no reliable tests available to screen for ovarian cancer at this time except for yearly pelvic exams.  Lung Cancer: Yearly chest X-rays can detect lung cancer and should be done on high risk women, such as cigarette smokers and women with chronic lung disease (emphysema).  Skin Cancer: A complete body skin exam should be done at your yearly examination. Avoid overexposure to the sun and ultraviolet light lamps. Use a strong sun block cream when in the sun. All of these things are important for lowering the risk of skin cancer. MENOPAUSE Menopause Symptoms: Hormone therapy products are effective for treating symptoms associated with menopause:  Moderate to severe hot flashes.  Night sweats.  Mood swings.  Headaches.  Tiredness.  Loss of sex drive.  Insomnia.  Other symptoms. Hormone replacement carries certain risks, especially in older women. Women who use or are thinking about using estrogen or estrogen with progestin treatments should discuss that with their caregiver. Your caregiver will help you understand the benefits and risks. The ideal dose of hormone replacement therapy is not known. The Food and Drug Administration (FDA) has concluded that hormone therapy should be used only at the lowest doses and for the shortest amount of time to reach treatment goals.  OSTEOPOROSIS Protecting Against Bone Loss and Preventing Fracture If you use hormone therapy for prevention of bone loss (osteoporosis), the risks for bone loss must outweigh the risk of the therapy. Ask your caregiver about other medications known to be safe and effective for preventing bone loss and fractures. To guard against bone loss or fractures, the following is recommended:  If  you are younger than age 50, take 1000 mg of calcium and at least 600 mg of Vitamin D per day.  If you are older than age 50 but younger than age 70, take 1200 mg of calcium and at least 600 mg of Vitamin D per day.  If you are older than age 70, take 1200 mg of calcium and at least 800 mg of Vitamin D per day. Smoking and excessive alcohol intake increases the risk of osteoporosis. Eat foods rich in calcium and vitamin D and do weight bearing exercises several times a week as your caregiver suggests. DIABETES Diabetes Mellitus: If you have type I or type 2 diabetes, you should keep your blood sugar under control with diet, exercise, and recommended medication. Avoid starchy and fatty foods, and too many sweets. Being overweight can make diabetes control more difficult. COGNITION AND MEMORY Cognition and Memory: Menopausal hormone therapy is not recommended for the prevention of cognitive disorders such as Alzheimer's disease or memory loss.  DEPRESSION  Depression may occur at any age, but it is common in elderly women. This may be because of physical, medical, social (loneliness), or financial problems and needs. If you are experiencing depression because of medical problems and control of symptoms, talk to your caregiver about this. Physical   activity and exercise may help with mood and sleep. Community and volunteer involvement may improve your sense of value and worth. If you have depression and you feel that the problem is getting worse or becoming severe, talk to your caregiver about which treatment options are best for you. ACCIDENTS  Accidents are common and can be serious in elderly woman. Prepare your house to prevent accidents. Eliminate throw rugs, place hand bars in bath, shower, and toilet areas. Avoid wearing high heeled shoes or walking on wet, snowy, and icy areas. Limit or stop driving if you have vision or hearing problems, or if you feel you are unsteady with your movements and  reflexes. HEPATITIS C Hepatitis C is a type of viral infection affecting the liver. It is spread mainly through contact with blood from an infected person. It can be treated, but if left untreated, it can lead to severe liver damage over the years. Many people who are infected do not know that the virus is in their blood. If you are a "baby-boomer", it is recommended that you have one screening test for Hepatitis C. IMMUNIZATIONS  Several immunizations are important to consider having during your senior years, including:   Tetanus, diphtheria, and pertussis booster shot.  Influenza every year before the flu season begins.  Pneumonia vaccine.  Shingles vaccine.  Others, as indicated based on your specific needs. Talk to your caregiver about these. Document Released: 07/13/2005 Document Revised: 10/05/2013 Document Reviewed: 03/08/2008 [employer] Patient Information 2015 [employer]. This information is not intended to replace advice given to you by your health care provider. Make sure you discuss any questions you have with your health care provider.  

## 2014-12-28 NOTE — Progress Notes (Signed)
Andrea Schroeder July 04, 1965 [id number]  History:    Presents for annual exam.  Monthly cycle using no contraception pregnancy okay. Both she and her husband travel with work. Normal mammogram history. 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 Currently helping to care for her mother dying of lung cancer age 47 hospice involved with care.  Past medical history, past surgical history, family history and social history were all reviewed and documented in the EPIC chart. 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 Has 2 dogs.  ROS:  A ROS was performed and pertinent positives and negatives are included.  Exam:  Filed Vitals:   12/28/14 1605  BP: 124/80    General appearance:  Normal Thyroid:  Symmetrical, normal in size, without palpable masses or nodularity. Respiratory  Auscultation:  Clear without wheezing or rhonchi Cardiovascular  Auscultation:  Regular rate, without rubs, murmurs or gallops  Edema/varicosities:  Not grossly evident Abdominal  Soft,nontender, without masses, guarding or rebound.  Liver/spleen:  No organomegaly noted  Hernia:  None appreciated  Skin  Inspection:  Grossly normal   Breasts: Examined lying and sitting.     Right: Without masses, retractions, discharge or axillary adenopathy.     Left: Without masses, retractions, discharge or axillary adenopathy. Gentitourinary   Inguinal/mons:  Normal without inguinal adenopathy  External genitalia:  Normal  BUS/Urethra/Skene's glands:  Normal  Vagina:  Normal  Cervix:  Normal  Uterus:   normal in size, shape and contour.  Midline and mobile  Adnexa/parametria:     Rt: Without masses or tenderness.   Lt: Without masses or tenderness.  Anus and perineum: Normal  Digital rectal exam: Normal sphincter tone without palpated masses or tenderness  Assessment/Plan:  49 y.o. MWF G0 for annual exam.   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 Return to office with missed cycle/no contraception for years with no pregnancy. Increase regular exercise, vitamin D 1000 daily encouraged. CBC, hemoglobin A1c, CMP, lipid panel, vitamin D, UA, Pap normal with negative HR HPV 2015, new screening guidelines reviewed.   Harrington Challenger [facility], 4:44 PM 12/28/2014

## 2014-12-29 ENCOUNTER — Other Ambulatory Visit: Payer: Self-pay | Admitting: Women's Health

## 2014-12-29 LAB — URINALYSIS W MICROSCOPIC + REFLEX CULTURE
BILIRUBIN URINE: NEGATIVE
CASTS: NONE SEEN [LPF]
CRYSTALS: NONE SEEN [HPF]
Glucose, UA: NEGATIVE
Hgb urine dipstick: NEGATIVE
Leukocytes, UA: NEGATIVE
Nitrite: NEGATIVE
PH: 6.5 (ref 5.0–8.0)
Protein, ur: NEGATIVE
SPECIFIC GRAVITY, URINE: 1.026 (ref 1.001–1.035)
SQUAMOUS EPITHELIAL / LPF: NONE SEEN [HPF] (ref <=5)
WBC UA: NONE SEEN WBC/HPF (ref <=5)
YEAST: NONE SEEN [HPF]

## 2014-12-29 LAB — VITAMIN D 25 HYDROXY (VIT D DEFICIENCY, FRACTURES): VIT D 25 HYDROXY: 22 ng/mL — AB (ref 30–100)

## 2014-12-29 LAB — TSH: TSH: 1.306 u[IU]/mL (ref 0.350–4.500)

## 2014-12-29 MED ORDER — VITAMIN D (ERGOCALCIFEROL) 1.25 MG (50000 UNIT) PO CAPS: 50000.0000 [IU] | ORAL_CAPSULE | ORAL | Status: DC

## 2014-12-31 LAB — URINE CULTURE

## 2015-01-03 ENCOUNTER — Other Ambulatory Visit: Payer: Self-pay | Admitting: Women's Health

## 2015-01-03 MED ORDER — SULFAMETHOXAZOLE-TRIMETHOPRIM 800-160 MG PO TABS: 1.0000 | ORAL_TABLET | Freq: Two times a day (BID) | ORAL | Status: DC

## 2015-05-19 ENCOUNTER — Encounter: Payer: Self-pay | Admitting: Women's Health

## 2016-01-25 ENCOUNTER — Ambulatory Visit (INDEPENDENT_AMBULATORY_CARE_PROVIDER_SITE_OTHER): Payer: 59 | Admitting: Women's Health

## 2016-01-25 ENCOUNTER — Encounter: Payer: Self-pay | Admitting: Women's Health

## 2016-01-25 VITALS — BP 116/70 | Ht 65.0 in | Wt 179.0 lb

## 2016-01-25 DIAGNOSIS — Z833 Family history of diabetes mellitus: Secondary | ICD-10-CM | POA: Diagnosis not present

## 2016-01-25 DIAGNOSIS — Z13220 Encounter for screening for lipoid disorders: Secondary | ICD-10-CM | POA: Diagnosis not present

## 2016-01-25 DIAGNOSIS — N920 Excessive and frequent menstruation with regular cycle: Secondary | ICD-10-CM

## 2016-01-25 DIAGNOSIS — Z01419 Encounter for gynecological examination (general) (routine) without abnormal findings: Secondary | ICD-10-CM

## 2016-01-25 LAB — LIPID PANEL
Cholesterol: 185 mg/dL (ref 125–200)
HDL: 74 mg/dL (ref >=46)
LDL CALC: 86 mg/dL (ref <130)
Total CHOL/HDL Ratio: 2.5 Ratio (ref <=5.0)
Triglycerides: 123 mg/dL (ref <150)
VLDL: 25 mg/dL (ref <30)

## 2016-01-25 LAB — COMPREHENSIVE METABOLIC PANEL
ALK PHOS: 49 U/L (ref 33–115)
ALT: 13 U/L (ref 6–29)
AST: 20 U/L (ref 10–35)
Albumin: 3.9 g/dL (ref 3.6–5.1)
BILIRUBIN TOTAL: 0.2 mg/dL (ref 0.2–1.2)
BUN: 14 mg/dL (ref 7–25)
CALCIUM: 9.4 mg/dL (ref 8.6–10.2)
CO2: 20 mmol/L (ref 20–31)
CREATININE: 0.76 mg/dL (ref 0.50–1.10)
Chloride: 103 mmol/L (ref 98–110)
GLUCOSE: 89 mg/dL (ref 65–99)
Potassium: 4.3 mmol/L (ref 3.5–5.3)
SODIUM: 134 mmol/L — AB (ref 135–146)
Total Protein: 6.9 g/dL (ref 6.1–8.1)

## 2016-01-25 LAB — CBC WITH DIFFERENTIAL/PLATELET
BASOS PCT: 1 %
Basophils Absolute: 94 cells/uL (ref 0–200)
EOS PCT: 5 %
Eosinophils Absolute: 470 cells/uL (ref 15–500)
HEMATOCRIT: 33.7 % — AB (ref 35.0–45.0)
Hemoglobin: 10.8 g/dL — ABNORMAL LOW (ref 11.7–15.5)
LYMPHS PCT: 27 %
Lymphs Abs: 2538 cells/uL (ref 850–3900)
MCH: 26.7 pg — ABNORMAL LOW (ref 27.0–33.0)
MCHC: 32 g/dL (ref 32.0–36.0)
MCV: 83.4 fL (ref 80.0–100.0)
MONO ABS: 846 {cells}/uL (ref 200–950)
MPV: 10.1 fL (ref 7.5–12.5)
Monocytes Relative: 9 %
Neutro Abs: 5452 cells/uL (ref 1500–7800)
Neutrophils Relative %: 58 %
PLATELETS: 660 10*3/uL — AB (ref 140–400)
RBC: 4.04 MIL/uL (ref 3.80–5.10)
RDW: 15 % (ref 11.0–15.0)
WBC: 9.4 10*3/uL (ref 3.8–10.8)

## 2016-01-25 MED ORDER — IBUPROFEN 600 MG PO TABS
600.0000 mg | ORAL_TABLET | Freq: Three times a day (TID) | ORAL | 1 refills | Status: DC | PRN
Start: 2016-01-25 — End: 2022-11-05

## 2016-01-25 NOTE — Progress Notes (Signed)
Andrea Schroeder 04/06/1966 [id number]    History:    Presents for annual exam.  Monthly cycle using no contraception pregnancy okay. Both she and her husband travel minimal time at home. Normal  mammogram history. 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 History of low vitamin D.  Past medical history, past surgical history, family history and social history were all reviewed and documented in the EPIC chart. 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 Father also died of lung cancer.  ROS:  A ROS was performed and pertinent positives and negatives are included.  Exam:  Vitals:   01/25/16 1506  BP: 116/70  Weight: 179 lb (81.2 kg)  Height: 5\' 5"  (1.651 m)   Body mass index is 29.79 kg/m.   General appearance:  Normal Thyroid:  Symmetrical, normal in size, without palpable masses or nodularity. Respiratory  Auscultation:  Clear without wheezing or rhonchi Cardiovascular  Auscultation:  Regular rate, without rubs, murmurs or gallops  Edema/varicosities:  Not grossly evident Abdominal  Soft,nontender, without masses, guarding or rebound.  Liver/spleen:  No organomegaly noted  Hernia:  None appreciated  Skin  Inspection:  Grossly normal   Breasts: Examined lying and sitting.     Right: Without masses, retractions, discharge or axillary adenopathy.     Left: Without masses, retractions, discharge or axillary adenopathy. Gentitourinary   Inguinal/mons:  Normal without inguinal adenopathy  External genitalia:  Normal  BUS/Urethra/Skene's glands:  Normal  Vagina:  Normal  Cervix:  Normal  Uterus:   normal in size, shape and contour.  Midline and mobile  Adnexa/parametria:     Rt: Without masses or tenderness.   Lt: Without masses or tenderness.  Anus and perineum: Normal  Digital rectal exam: Normal sphincter tone without palpated masses or tenderness  Assessment/Plan:  50 y.o. MWF G0 for annual exam with intermittent upper left  shoulder discomfort similar to what she had prior to ruptured spleen.  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 Declines referral today. Client need for contraception. SBE's, continue annual 3-D screening mammogram history of dense breasts. Exercise, calcium rich diet, increasing leisure activities, self-care encouraged. CBC, vitamin D, hemoglobin A1c, TSH, CMP, lipid panel, UA, Pap normal 2015 with negative HR HPV new screening guidelines reviewed.    Harrington ChallengerYOUNG,Madhavi Hamblen J [facility] Grosse PointeWHNP, 5:54 PM 01/25/2016

## 2016-01-25 NOTE — Patient Instructions (Signed)
Health Maintenance, Female Adopting a healthy lifestyle and getting preventive care can go a long way to promote health and wellness. Talk with your health care provider about what schedule of regular examinations is right for you. This is a good chance for you to check in with your provider about disease prevention and staying healthy. In between checkups, there are plenty of things you can do on your own. Experts have done a lot of research about which lifestyle changes and preventive measures are most likely to keep you healthy. Ask your health care provider for more information. WEIGHT AND DIET  Eat a healthy diet  Be sure to include plenty of vegetables, fruits, low-fat dairy products, and lean protein.  Do not eat a lot of foods high in solid fats, added sugars, or salt.  Get regular exercise. This is one of the most important things you can do for your health.  Most adults should exercise for at least 150 minutes each week. The exercise should increase your heart rate and make you sweat (moderate-intensity exercise).  Most adults should also do strengthening exercises at least twice a week. This is in addition to the moderate-intensity exercise.  Maintain a healthy weight  Body mass index (BMI) is a measurement that can be used to identify possible weight problems. It estimates body fat based on height and weight. Your health care provider can help determine your BMI and help you achieve or maintain a healthy weight.  For females 20 years of age and older:   A BMI below 18.5 is considered underweight.  A BMI of 18.5 to 24.9 is normal.  A BMI of 25 to 29.9 is considered overweight.  A BMI of 30 and above is considered obese.  Watch levels of cholesterol and blood lipids  You should start having your blood tested for lipids and cholesterol at 48 years of age, then have this test every 5 years.  You may need to have your cholesterol levels checked more often if:  Your lipid  or cholesterol levels are high.  You are older than 48 years of age.  You are at high risk for heart disease.  CANCER SCREENING   Lung Cancer  Lung cancer screening is recommended for adults 55-80 years old who are at high risk for lung cancer because of a history of smoking.  A yearly low-dose CT scan of the lungs is recommended for people who:  Currently smoke.  Have quit within the past 15 years.  Have at least a 30-pack-year history of smoking. A pack year is smoking an average of one pack of cigarettes a day for 1 year.  Yearly screening should continue until it has been 15 years since you quit.  Yearly screening should stop if you develop a health problem that would prevent you from having lung cancer treatment.  Breast Cancer  Practice breast self-awareness. This means understanding how your breasts normally appear and feel.  It also means doing regular breast self-exams. Let your health care provider know about any changes, no matter how small.  If you are in your 20s or 30s, you should have a clinical breast exam (CBE) by a health care provider every 1-3 years as part of a regular health exam.  If you are 40 or older, have a CBE every year. Also consider having a breast X-ray (mammogram) every year.  If you have a family history of breast cancer, talk to your health care provider about genetic screening.  If you   are at high risk for breast cancer, talk to your health care provider about having an MRI and a mammogram every year.  Breast cancer gene (BRCA) assessment is recommended for women who have family members with BRCA-related cancers. BRCA-related cancers include:  Breast.  Ovarian.  Tubal.  Peritoneal cancers.  Results of the assessment will determine the need for genetic counseling and BRCA1 and BRCA2 testing. Cervical Cancer Your health care provider may recommend that you be screened regularly for cancer of the pelvic organs (ovaries, uterus, and  vagina). This screening involves a pelvic examination, including checking for microscopic changes to the surface of your cervix (Pap test). You may be encouraged to have this screening done every 3 years, beginning at age 33.  For women ages 52-65, health care providers may recommend pelvic exams and Pap testing every 3 years, or they may recommend the Pap and pelvic exam, combined with testing for human papilloma virus (HPV), every 5 years. Some types of HPV increase your risk of cervical cancer. Testing for HPV may also be done on women of any age with unclear Pap test results.  Other health care providers may not recommend any screening for nonpregnant women who are considered low risk for pelvic cancer and who do not have symptoms. Ask your health care provider if a screening pelvic exam is right for you.  If you have had past treatment for cervical cancer or a condition that could lead to cancer, you need Pap tests and screening for cancer for at least 20 years after your treatment. If Pap tests have been discontinued, your risk factors (such as having a new sexual partner) need to be reassessed to determine if screening should resume. Some women have medical problems that increase the chance of getting cervical cancer. In these cases, your health care provider may recommend more frequent screening and Pap tests. Colorectal Cancer  This type of cancer can be detected and often prevented.  Routine colorectal cancer screening usually begins at 48 years of age and continues through 48 years of age.  Your health care provider may recommend screening at an earlier age if you have risk factors for colon cancer.  Your health care provider may also recommend using home test kits to check for hidden blood in the stool.  A small camera at the end of a tube can be used to examine your colon directly (sigmoidoscopy or colonoscopy). This is done to check for the earliest forms of colorectal  cancer.  Routine screening usually begins at age 65.  Direct examination of the colon should be repeated every 5-10 years through 48 years of age. However, you may need to be screened more often if early forms of precancerous polyps or small growths are found. Skin Cancer  Check your skin from head to toe regularly.  Tell your health care provider about any new moles or changes in moles, especially if there is a change in a mole's shape or color.  Also tell your health care provider if you have a mole that is larger than the size of a pencil eraser.  Always use sunscreen. Apply sunscreen liberally and repeatedly throughout the day.  Protect yourself by wearing long sleeves, pants, a wide-brimmed hat, and sunglasses whenever you are outside. HEART DISEASE, DIABETES, AND HIGH BLOOD PRESSURE   High blood pressure causes heart disease and increases the risk of stroke. High blood pressure is more likely to develop in:  People who have blood pressure in the high end  of the normal range (130-139/85-89 mm Hg).  People who are overweight or obese.  People who are African American.  If you are 76-16 years of age, have your blood pressure checked every 3-5 years. If you are 43 years of age or older, have your blood pressure checked every year. You should have your blood pressure measured twice--once when you are at a hospital or clinic, and once when you are not at a hospital or clinic. Record the average of the two measurements. To check your blood pressure when you are not at a hospital or clinic, you can use:  An automated blood pressure machine at a pharmacy.  A home blood pressure monitor.  If you are between 66 years and 58 years old, ask your health care provider if you should take aspirin to prevent strokes.  Have regular diabetes screenings. This involves taking a blood sample to check your fasting blood sugar level.  If you are at a normal weight and have a low risk for diabetes,  have this test once every three years after 48 years of age.  If you are overweight and have a high risk for diabetes, consider being tested at a younger age or more often. PREVENTING INFECTION  Hepatitis B  If you have a higher risk for hepatitis B, you should be screened for this virus. You are considered at high risk for hepatitis B if:  You were born in a country where hepatitis B is common. Ask your health care provider which countries are considered high risk.  Your parents were born in a high-risk country, and you have not been immunized against hepatitis B (hepatitis B vaccine).  You have HIV or AIDS.  You use needles to inject street drugs.  You live with someone who has hepatitis B.  You have had sex with someone who has hepatitis B.  You get hemodialysis treatment.  You take certain medicines for conditions, including cancer, organ transplantation, and autoimmune conditions. Hepatitis C  Blood testing is recommended for:  Everyone born from [birth year] through 1963.  Anyone with known risk factors for hepatitis C. Sexually transmitted infections (STIs)  You should be screened for sexually transmitted infections (STIs) including gonorrhea and chlamydia if:  You are sexually active and are younger than 48 years of age.  You are older than 48 years of age and your health care provider tells you that you are at risk for this type of infection.  Your sexual activity has changed since you were last screened and you are at an increased risk for chlamydia or gonorrhea. Ask your health care provider if you are at risk.  If you do not have HIV, but are at risk, it may be recommended that you take a prescription medicine daily to prevent HIV infection. This is called pre-exposure prophylaxis (PrEP). You are considered at risk if:  You are sexually active and do not regularly use condoms or know the HIV status of your partner(s).  You take drugs by injection.  You are sexually  active with a partner who has HIV. Talk with your health care provider about whether you are at high risk of being infected with HIV. If you choose to begin PrEP, you should first be tested for HIV. You should then be tested every 3 months for as long as you are taking PrEP.  PREGNANCY   If you are premenopausal and you may become pregnant, ask your health care provider about preconception counseling.  If you may  become pregnant, take 400 to 800 micrograms (mcg) of folic acid every day.  If you want to prevent pregnancy, talk to your health care provider about birth control (contraception). OSTEOPOROSIS AND MENOPAUSE   Osteoporosis is a disease in which the bones lose minerals and strength with aging. This can result in serious bone fractures. Your risk for osteoporosis can be identified using a bone density scan.  If you are 61 years of age or older, or if you are at risk for osteoporosis and fractures, ask your health care provider if you should be screened.  Ask your health care provider whether you should take a calcium or vitamin D supplement to lower your risk for osteoporosis.  Menopause may have certain physical symptoms and risks.  Hormone replacement therapy may reduce some of these symptoms and risks. Talk to your health care provider about whether hormone replacement therapy is right for you.  HOME CARE INSTRUCTIONS   Schedule regular health, dental, and eye exams.  Stay current with your immunizations.   Do not use any tobacco products including cigarettes, chewing tobacco, or electronic cigarettes.  If you are pregnant, do not drink alcohol.  If you are breastfeeding, limit how much and how often you drink alcohol.  Limit alcohol intake to no more than 1 drink per day for nonpregnant women. One drink equals 12 ounces of beer, 5 ounces of wine, or 1 ounces of hard liquor.  Do not use street drugs.  Do not share needles.  Ask your health care provider for help if  you need support or information about quitting drugs.  Tell your health care provider if you often feel depressed.  Tell your health care provider if you have ever been abused or do not feel safe at home.   This information is not intended to replace advice given to you by your health care provider. Make sure you discuss any questions you have with your health care provider.   Document Released: 12/04/2010 Document Revised: 06/11/2014 Document Reviewed: 04/22/2013 Elsevier Interactive Patient Education [employer].

## 2016-01-26 LAB — URINALYSIS W MICROSCOPIC + REFLEX CULTURE
BACTERIA UA: NONE SEEN [HPF]
Bilirubin Urine: NEGATIVE
Casts: NONE SEEN [LPF]
Crystals: NONE SEEN [HPF]
GLUCOSE, UA: NEGATIVE
LEUKOCYTES UA: NEGATIVE
Nitrite: NEGATIVE
PROTEIN: NEGATIVE
Specific Gravity, Urine: 1.014 (ref 1.001–1.035)
WBC, UA: NONE SEEN WBC/HPF (ref <=5)
YEAST: NONE SEEN [HPF]
pH: 5.5 (ref 5.0–8.0)

## 2016-01-26 LAB — HEMOGLOBIN A1C
HEMOGLOBIN A1C: 5.5 % (ref <5.7)
MEAN PLASMA GLUCOSE: 111 mg/dL

## 2016-01-26 LAB — VITAMIN D 25 HYDROXY (VIT D DEFICIENCY, FRACTURES): VIT D 25 HYDROXY: 33 ng/mL (ref 30–100)

## 2016-01-26 LAB — TSH: TSH: 1.15 mIU/L

## 2016-01-27 LAB — URINE CULTURE: Organism ID, Bacteria: 10000

## 2016-02-22 ENCOUNTER — Telehealth: Payer: Self-pay | Admitting: *Deleted

## 2016-02-22 NOTE — Telephone Encounter (Signed)
Telephone call. States cycles are heavier and closer but does not think they are less than 21 days from today 1 day one. Will keep a record. Options of sonohysterogram and possibly a ablation were reviewed reviewed office procedures will call back if cycles are less than 21 days and if cycles continue to be heavy. Cycles are now lasting 5-7 days have gradually increased over the past year.

## 2016-02-22 NOTE — Telephone Encounter (Signed)
Pt called to follow up was seen at annual states she mentioned to you having heavy cycle in July, called today stating her cycle started today and this cycle being the 4th one since July. Had previous cycle 2 weeks ago, this cycle was not due to start into 2 weeks from now. Pt said you told her to follow up if this continues. Please advise

## 2016-05-22 ENCOUNTER — Encounter: Payer: Self-pay | Admitting: Women's Health

## 2017-02-19 ENCOUNTER — Encounter: Payer: Self-pay | Admitting: Women's Health

## 2017-02-19 ENCOUNTER — Ambulatory Visit (INDEPENDENT_AMBULATORY_CARE_PROVIDER_SITE_OTHER): Payer: 59 | Admitting: Women's Health

## 2017-02-19 VITALS — BP 118/82 | Ht 65.0 in | Wt 175.0 lb

## 2017-02-19 DIAGNOSIS — Z01419 Encounter for gynecological examination (general) (routine) without abnormal findings: Secondary | ICD-10-CM | POA: Diagnosis not present

## 2017-02-19 DIAGNOSIS — N912 Amenorrhea, unspecified: Secondary | ICD-10-CM | POA: Diagnosis not present

## 2017-02-19 NOTE — Progress Notes (Signed)
Andrea Schroeder 1945/06/19 [id number]    History:    Presents for annual exam. Last cycle December 2017, occasional hot flushes. Not sexually active greater than a year separated from husband , denies need for STD screen. 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 Normal mammogram history. 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 Reports all normal labs at work health screening.  Past medical history, past surgical history, family history and social history were all reviewed and documented in the EPIC chart. Works at [employer] travels with work. Both parents died of lung cancer in their 51s  ROS:  A ROS was performed and pertinent positives and negatives are included.  Exam:  Vitals:   02/19/17 0806  BP: 118/82  Weight: 175 lb (79.4 kg)  Height:  (1.651 m)   Body mass index is 29.12 kg/m.   General appearance:  Normal Thyroid:  Symmetrical, normal in size, without palpable masses or nodularity. Respiratory  Auscultation:  Clear without wheezing or rhonchi Cardiovascular  Auscultation:  Regular rate, without rubs, murmurs or gallops  Edema/varicosities:  Not grossly evident Abdominal  Soft,nontender, without masses, guarding or rebound.  Liver/spleen:  No organomegaly noted  Hernia:  None appreciated  Skin  Inspection:  Grossly normal   Breasts: Examined lying and sitting.     Right: Without masses, retractions, discharge or axillary adenopathy.     Left: Without masses, retractions, discharge or axillary adenopathy. Gentitourinary   Inguinal/mons:  Normal without inguinal adenopathy  External genitalia:  Normal  BUS/Urethra/Skene's glands:  Normal  Vagina:  Normal  Cervix:  Normal  Uterus: normal in size, shape and contour.  Midline and mobile  Adnexa/parametria:     Rt: Without masses or tenderness.   Lt: Without masses or tenderness.  Anus and perineum: Normal  Digital rectal exam: Normal sphincter tone without palpated masses or tenderness  Assessment/Plan:  51 y.o. separated WF G0  for  annual exam with no complaints.  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 SBE's, continue annual screening mammogram, due in December. Continue active lifestyle, regular exercise, calcium rich diet, vitamin D 1000 daily encouraged. Pap with HR HPV typing, new screening guidelines reviewed.Harrington Challenger [facility], 8:42 AM 02/19/2017

## 2017-02-19 NOTE — Patient Instructions (Signed)

## 2017-02-20 LAB — PAP, TP IMAGING W/ HPV RNA, RFLX HPV TYPE 16,18/45: HPV DNA High Risk: NOT DETECTED

## 2017-02-20 LAB — FOLLICLE STIMULATING HORMONE: FSH: 41.5 m[IU]/mL

## 2017-05-22 ENCOUNTER — Encounter: Payer: Self-pay | Admitting: Women's Health

## 2018-02-13 ENCOUNTER — Telehealth: Payer: Self-pay | Admitting: *Deleted

## 2018-02-13 NOTE — Telephone Encounter (Signed)
Patient seen her therapist Andrea Schroeder [phone]) and she thought patient would benefit starting on Wellbutrin to help with ADD, patient is not currently taking any medications to help manage ADD. Annual exam scheduled on 04/15/18. Office visit to discuss? Please advise

## 2018-02-13 NOTE — Telephone Encounter (Signed)
Message left

## 2018-02-17 NOTE — Telephone Encounter (Signed)
Patient retuning you call can call at [phone]

## 2018-02-17 NOTE — Telephone Encounter (Signed)
Message left

## 2018-02-21 ENCOUNTER — Other Ambulatory Visit: Payer: Self-pay | Admitting: Women's Health

## 2018-02-21 MED ORDER — BUPROPION HCL 75 MG PO TABS: ORAL_TABLET | ORAL | 0 refills | Status: DC

## 2018-02-21 NOTE — Telephone Encounter (Signed)
TC left detailed message will try wellbutrin 75 in am for 2 weeks and then increase to 1 am 1 at noon, will evaluate at annual in november

## 2018-03-10 ENCOUNTER — Telehealth: Payer: Self-pay | Admitting: *Deleted

## 2018-03-10 NOTE — Telephone Encounter (Signed)
Patient called reports she was traveling via plane this past weekend and then reports having chest pain, sweating , heavy feeling in arms, vision problems, flight attend helped her with cold towel, symptoms went away. She got home and felt very tired, but she has felt tired off/on since August. 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  Patient does not have PCP, only comes to you. I told patient I would have to check with you for approval for orders , CMP, Lipid, TSH, CBC w/Diff, Vit. D3, b12, A1C, Fasting insulin, iron ferratin.   Order if you recommend she come for OV with you? Please advise

## 2018-03-10 NOTE — Telephone Encounter (Signed)
Yes ok for work nurse to check labs

## 2018-03-11 NOTE — Telephone Encounter (Signed)
Left message for patient to call.

## 2018-03-11 NOTE — Telephone Encounter (Signed)
Patient called back with a fax # [phone], order faxed.

## 2018-03-13 NOTE — Telephone Encounter (Signed)
Lab orders given Jerold Coombe RN [phone].

## 2018-03-14 ENCOUNTER — Encounter: Payer: Self-pay | Admitting: Women's Health

## 2018-04-15 ENCOUNTER — Encounter: Payer: Self-pay | Admitting: Women's Health

## 2018-04-15 ENCOUNTER — Ambulatory Visit (INDEPENDENT_AMBULATORY_CARE_PROVIDER_SITE_OTHER): Payer: 59 | Admitting: Women's Health

## 2018-04-15 VITALS — BP 118/78 | Ht 65.0 in | Wt 172.0 lb

## 2018-04-15 DIAGNOSIS — Z01419 Encounter for gynecological examination (general) (routine) without abnormal findings: Secondary | ICD-10-CM

## 2018-04-15 NOTE — Progress Notes (Signed)
Andrea Schroeder 1966/03/29 [id number]    History:    Presents for annual exam.  Amenorrheic x1 year 1 day light spotting 3 months ago.  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  Normal mammogram history.  Labs at health screening at work normal CBC, thyroid function, vitamin D, overall cholesterol slightly elevated, normal  triglycerides and HDL.  Hemoglobin A1c 5.9.  History of depression and ADD was going to start Wellbutrin but never did.  Past medical history, past surgical history, family history and social history were all reviewed and documented in the EPIC chart.  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  Parents both deceased lung cancer in their 7580s.  Divorce is final not sexually active.  Going to [place] tomorrow for vacation.  ROS:  A ROS was performed and pertinent positives and negatives are included.  Exam:  Vitals:   04/15/18 1559  BP: 118/78  Weight: 172 lb (78 kg)  Height: 5\' 5"  (1.651 m)   Body mass index is 28.62 kg/m.   General appearance:  Normal Thyroid:  Symmetrical, normal in size, without palpable masses or nodularity. Respiratory  Auscultation:  Clear without wheezing or rhonchi Cardiovascular  Auscultation:  Regular rate, without rubs, murmurs or gallops  Edema/varicosities:  Not grossly evident Abdominal  Soft,nontender, without masses, guarding or rebound.  Liver/spleen:  No organomegaly noted  Hernia:  None appreciated  Skin  Inspection:  Grossly normal   Breasts: Examined lying and sitting.     Right: Without masses, retractions, discharge or axillary adenopathy.     Left: Without masses, retractions, discharge or axillary adenopathy. Gentitourinary   Inguinal/mons:  Normal without inguinal adenopathy  External genitalia:  Normal  BUS/Urethra/Skene's glands:  Normal  Vagina: Mild atrophy  Cervix:  Normal  Uterus:  normal in size, shape and contour.  Midline and mobile  Adnexa/parametria:      Rt: Without masses or tenderness.   Lt: Without masses or tenderness.  Anus and perineum: Normal  Digital rectal exam: Normal sphincter tone without palpated masses or tenderness  Assessment/Plan:  52 y.o. D WF G0 for annual exam with no complaints.  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 Tolerating hot flushes without difficulty.  Condoms encouraged if sexually active.  SBE's, continue annual screening mammogram, calcium rich foods, vitamin D 2000 daily encouraged.  Having right knee problems and possibly needing surgery making  exercise difficult. Screening colonoscopy reviewed [facility] information given instructed to schedule.  Encouraged to decrease simple sugars/carbs and exercise as able.  Pap normal 2018, new screening guidelines reviewed.    Andrea Challengerancy J Myleigh Schroeder [facility], 4:35 PM 04/15/2018

## 2018-04-15 NOTE — Patient Instructions (Addendum)
[facility] Carlean Purl [phone]   Carbohydrate Counting for Diabetes Mellitus, Adult Carbohydrate counting is a method for keeping track of how many carbohydrates you eat. Eating carbohydrates naturally increases the amount of sugar (glucose) in the blood. Counting how many carbohydrates you eat helps keep your blood glucose within normal limits, which helps you manage your diabetes (diabetes mellitus). It is important to know how many carbohydrates you can safely have in each meal. This is different for every person. A diet and nutrition specialist (registered dietitian) can help you make a meal plan and calculate how many carbohydrates you should have at each meal and snack. Carbohydrates are found in the following foods:  Grains, such as breads and cereals.  Dried beans and soy products.  Starchy vegetables, such as potatoes, peas, and corn.  Fruit and fruit juices.  Milk and yogurt.  Sweets and snack foods, such as cake, cookies, candy, chips, and soft drinks.  How do I count carbohydrates? There are two ways to count carbohydrates in food. You can use either of the methods or a combination of both. Reading "Nutrition Facts" on packaged food The "Nutrition Facts" list is included on the labels of almost all packaged foods and beverages in the [place] It includes:  The serving size.  Information about nutrients in each serving, including the grams (g) of carbohydrate per serving.  To use the "Nutrition Facts":  Decide how many servings you will have.  Multiply the number of servings by the number of carbohydrates per serving.  The resulting number is the total amount of carbohydrates that you will be having.  Learning standard serving sizes of other foods When you eat foods containing carbohydrates that are not packaged or do not include "Nutrition Facts" on the label, you need to measure the servings in order to count the amount of carbohydrates:  Measure the  foods that you will eat with a food scale or measuring cup, if needed.  Decide how many standard-size servings you will eat.  Multiply the number of servings by 15. Most carbohydrate-rich foods have about 15 g of carbohydrates per serving. ? For example, if you eat 8 oz (170 g) of strawberries, you will have eaten 2 servings and 30 g of carbohydrates (2 servings x 15 g = 30 g).  For foods that have more than one food mixed, such as soups and casseroles, you must count the carbohydrates in each food that is included.  The following list contains standard serving sizes of common carbohydrate-rich foods. Each of these servings has about 15 g of carbohydrates:   hamburger bun or  English muffin.   oz (15 mL) syrup.   oz (14 g) jelly.  1 slice of bread.  1 six-inch tortilla.  3 oz (85 g) cooked rice or pasta.  4 oz (113 g) cooked dried beans.  4 oz (113 g) starchy vegetable, such as peas, corn, or potatoes.  4 oz (113 g) hot cereal.  4 oz (113 g) mashed potatoes or  of a large baked potato.  4 oz (113 g) canned or frozen fruit.  4 oz (120 mL) fruit juice.  4-6 crackers.  6 chicken nuggets.  6 oz (170 g) unsweetened dry cereal.  6 oz (170 g) plain fat-free yogurt or yogurt sweetened with artificial sweeteners.  8 oz (240 mL) milk.  8 oz (170 g) fresh fruit or one small piece of fruit.  24 oz (680 g) popped popcorn.  Example of carbohydrate counting Sample  meal  3 oz (85 g) chicken breast.  6 oz (170 g) brown rice.  4 oz (113 g) corn.  8 oz (240 mL) milk.  8 oz (170 g) strawberries with sugar-free whipped topping. Carbohydrate calculation 1. Identify the foods that contain carbohydrates: ? Rice. ? Corn. ? Milk. ? Strawberries. 2. Calculate how many servings you have of each food: ? 2 servings rice. ? 1 serving corn. ? 1 serving milk. ? 1 serving strawberries. 3. Multiply each number of servings by 15 g: ? 2 servings rice x 15 g = 30 g. ? 1  serving corn x 15 g = 15 g. ? 1 serving milk x 15 g = 15 g. ? 1 serving strawberries x 15 g = 15 g. 4. Add together all of the amounts to find the total grams of carbohydrates eaten: ? 30 g + 15 g + 15 g + 15 g = 75 g of carbohydrates total. This information is not intended to replace advice given to you by your health care provider. Make sure you discuss any questions you have with your health care provider. Document Released: 05/21/2005 Document Revised: 12/09/2015 Document Reviewed: 11/02/2015 Elsevier Interactive Patient Education  2018 [place] Maintenance for Postmenopausal Women Menopause is a normal process in which your reproductive ability comes to an end. This process happens gradually over a span of months to years, usually between the ages of 44 and 54. Menopause is complete when you have missed 12 consecutive menstrual periods. It is important to talk with your health care provider about some of the most common conditions that affect postmenopausal women, such as heart disease, cancer, and bone loss (osteoporosis). Adopting a healthy lifestyle and getting preventive care can help to promote your health and wellness. Those actions can also lower your chances of developing some of these common conditions. What should I know about menopause? During menopause, you may experience a number of symptoms, such as:  Moderate-to-severe hot flashes.  Night sweats.  Decrease in sex drive.  Mood swings.  Headaches.  Tiredness.  Irritability.  Memory problems.  Insomnia.  Choosing to treat or not to treat menopausal changes is an individual decision that you make with your health care provider. What should I know about hormone replacement therapy and supplements? Hormone therapy products are effective for treating symptoms that are associated with menopause, such as hot flashes and night sweats. Hormone replacement carries certain risks, especially as you become older.  If you are thinking about using estrogen or estrogen with progestin treatments, discuss the benefits and risks with your health care provider. What should I know about heart disease and stroke? Heart disease, heart attack, and stroke become more likely as you age. This may be due, in part, to the hormonal changes that your body experiences during menopause. These can affect how your body processes dietary fats, triglycerides, and cholesterol. Heart attack and stroke are both medical emergencies. There are many things that you can do to help prevent heart disease and stroke:  Have your blood pressure checked at least every 1-2 years. High blood pressure causes heart disease and increases the risk of stroke.  If you are 57-57 years old, ask your health care provider if you should take aspirin to prevent a heart attack or a stroke.  Do not use any tobacco products, including cigarettes, chewing tobacco, or electronic cigarettes. If you need help quitting, ask your health care provider.  It is important to eat a healthy diet and  maintain a healthy weight. ? Be sure to include plenty of vegetables, fruits, low-fat dairy products, and lean protein. ? Avoid eating foods that are high in solid fats, added sugars, or salt (sodium).  Get regular exercise. This is one of the most important things that you can do for your health. ? Try to exercise for at least 150 minutes each week. The type of exercise that you do should increase your heart rate and make you sweat. This is known as moderate-intensity exercise. ? Try to do strengthening exercises at least twice each week. Do these in addition to the moderate-intensity exercise.  Know your numbers.Ask your health care provider to check your cholesterol and your blood glucose. Continue to have your blood tested as directed by your health care provider.  What should I know about cancer screening? There are several types of cancer. Take the following steps to  reduce your risk and to catch any cancer development as early as possible. Breast Cancer  Practice breast self-awareness. ? This means understanding how your breasts normally appear and feel. ? It also means doing regular breast self-exams. Let your health care provider know about any changes, no matter how small.  If you are 3 or older, have a clinician do a breast exam (clinical breast exam or CBE) every year. Depending on your age, family history, and medical history, it may be recommended that you also have a yearly breast X-ray (mammogram).  If you have a family history of breast cancer, talk with your health care provider about genetic screening.  If you are at high risk for breast cancer, talk with your health care provider about having an MRI and a mammogram every year.  Breast cancer (BRCA) gene test is recommended for women who have family members with BRCA-related cancers. Results of the assessment will determine the need for genetic counseling and BRCA1 and for BRCA2 testing. BRCA-related cancers include these types: ? Breast. This occurs in males or females. ? Ovarian. ? Tubal. This may also be called fallopian tube cancer. ? Cancer of the abdominal or pelvic lining (peritoneal cancer). ? Prostate. ? Pancreatic.  Cervical, Uterine, and Ovarian Cancer Your health care provider may recommend that you be screened regularly for cancer of the pelvic organs. These include your ovaries, uterus, and vagina. This screening involves a pelvic exam, which includes checking for microscopic changes to the surface of your cervix (Pap test).  For women ages 21-65, health care providers may recommend a pelvic exam and a Pap test every three years. For women ages 55-65, they may recommend the Pap test and pelvic exam, combined with testing for human papilloma virus (HPV), every five years. Some types of HPV increase your risk of cervical cancer. Testing for HPV may also be done on women of any  age who have unclear Pap test results.  Other health care providers may not recommend any screening for nonpregnant women who are considered low risk for pelvic cancer and have no symptoms. Ask your health care provider if a screening pelvic exam is right for you.  If you have had past treatment for cervical cancer or a condition that could lead to cancer, you need Pap tests and screening for cancer for at least 20 years after your treatment. If Pap tests have been discontinued for you, your risk factors (such as having a new sexual partner) need to be reassessed to determine if you should start having screenings again. Some women have medical problems that increase the  chance of getting cervical cancer. In these cases, your health care provider may recommend that you have screening and Pap tests more often.  If you have a family history of uterine cancer or ovarian cancer, talk with your health care provider about genetic screening.  If you have vaginal bleeding after reaching menopause, tell your health care provider.  There are currently no reliable tests available to screen for ovarian cancer.  Lung Cancer Lung cancer screening is recommended for adults 65-67 years old who are at high risk for lung cancer because of a history of smoking. A yearly low-dose CT scan of the lungs is recommended if you:  Currently smoke.  Have a history of at least 30 pack-years of smoking and you currently smoke or have quit within the past 15 years. A pack-year is smoking an average of one pack of cigarettes per day for one year.  Yearly screening should:  Continue until it has been 15 years since you quit.  Stop if you develop a health problem that would prevent you from having lung cancer treatment.  Colorectal Cancer  This type of cancer can be detected and can often be prevented.  Routine colorectal cancer screening usually begins at age 70 and continues through age 70.  If you have risk factors  for colon cancer, your health care provider may recommend that you be screened at an earlier age.  If you have a family history of colorectal cancer, talk with your health care provider about genetic screening.  Your health care provider may also recommend using home test kits to check for hidden blood in your stool.  A small camera at the end of a tube can be used to examine your colon directly (sigmoidoscopy or colonoscopy). This is done to check for the earliest forms of colorectal cancer.  Direct examination of the colon should be repeated every 5-10 years until age 40. However, if early forms of precancerous polyps or small growths are found or if you have a family history or genetic risk for colorectal cancer, you may need to be screened more often.  Skin Cancer  Check your skin from head to toe regularly.  Monitor any moles. Be sure to tell your health care provider: ? About any new moles or changes in moles, especially if there is a change in a mole's shape or color. ? If you have a mole that is larger than the size of a pencil eraser.  If any of your family members has a history of skin cancer, especially at a Vesna Kable age, talk with your health care provider about genetic screening.  Always use sunscreen. Apply sunscreen liberally and repeatedly throughout the day.  Whenever you are outside, protect yourself by wearing long sleeves, pants, a wide-brimmed hat, and sunglasses.  What should I know about osteoporosis? Osteoporosis is a condition in which bone destruction happens more quickly than new bone creation. After menopause, you may be at an increased risk for osteoporosis. To help prevent osteoporosis or the bone fractures that can happen because of osteoporosis, the following is recommended:  If you are 34-23 years old, get at least 1,000 mg of calcium and at least 600 mg of vitamin D per day.  If you are older than age 23 but younger than age 50, get at least 1,200 mg of  calcium and at least 600 mg of vitamin D per day.  If you are older than age 59, get at least 1,200 mg of calcium and at  least 800 mg of vitamin D per day.  Smoking and excessive alcohol intake increase the risk of osteoporosis. Eat foods that are rich in calcium and vitamin D, and do weight-bearing exercises several times each week as directed by your health care provider. What should I know about how menopause affects my mental health? Depression may occur at any age, but it is more common as you become older. Common symptoms of depression include:  Low or sad mood.  Changes in sleep patterns.  Changes in appetite or eating patterns.  Feeling an overall lack of motivation or enjoyment of activities that you previously enjoyed.  Frequent crying spells.  Talk with your health care provider if you think that you are experiencing depression. What should I know about immunizations? It is important that you get and maintain your immunizations. These include:  Tetanus, diphtheria, and pertussis (Tdap) booster vaccine.  Influenza every year before the flu season begins.  Pneumonia vaccine.  Shingles vaccine.  Your health care provider may also recommend other immunizations. This information is not intended to replace advice given to you by your health care provider. Make sure you discuss any questions you have with your health care provider. Document Released: 07/13/2005 Document Revised: 12/09/2015 Document Reviewed: 02/22/2015 Elsevier Interactive Patient Education  2018 [employer].

## 2018-05-26 ENCOUNTER — Encounter: Payer: Self-pay | Admitting: Women's Health

## 2018-05-30 ENCOUNTER — Encounter: Payer: Self-pay | Admitting: Women's Health

## 2019-04-21 ENCOUNTER — Other Ambulatory Visit: Payer: Self-pay

## 2019-04-22 ENCOUNTER — Ambulatory Visit (INDEPENDENT_AMBULATORY_CARE_PROVIDER_SITE_OTHER): Payer: 59 | Admitting: Women's Health

## 2019-04-22 ENCOUNTER — Encounter: Payer: Self-pay | Admitting: Women's Health

## 2019-04-22 VITALS — BP 118/80 | Ht 65.0 in | Wt 170.0 lb

## 2019-04-22 DIAGNOSIS — Z01419 Encounter for gynecological examination (general) (routine) without abnormal findings: Secondary | ICD-10-CM | POA: Diagnosis not present

## 2019-04-22 NOTE — Patient Instructions (Addendum)
Colonoscopy  [facility]  [phone]  Dr Leone PayorGessner Vit 2000 iu daily  Good to see you!! Low sugar carb diet Carbohydrate Counting for Diabetes Mellitus, Adult  Carbohydrate counting is a method of keeping track of how many carbohydrates you eat. Eating carbohydrates naturally increases the amount of sugar (glucose) in the blood. Counting how many carbohydrates you eat helps keep your blood glucose within normal limits, which helps you manage your diabetes (diabetes mellitus). It is important to know how many carbohydrates you can safely have in each meal. This is different for every person. A diet and nutrition specialist (registered dietitian) can help you make a meal plan and calculate how many carbohydrates you should have at each meal and snack. Carbohydrates are found in the following foods:  Grains, such as breads and cereals.  Dried beans and soy products.  Starchy vegetables, such as potatoes, peas, and corn.  Fruit and fruit juices.  Milk and yogurt.  Sweets and snack foods, such as cake, cookies, candy, chips, and soft drinks. How do I count carbohydrates? There are two ways to count carbohydrates in food. You can use either of the methods or a combination of both. Reading "Nutrition Facts" on packaged food The "Nutrition Facts" list is included on the labels of almost all packaged foods and beverages in the [place] It includes:  The serving size.  Information about nutrients in each serving, including the grams (g) of carbohydrate per serving. To use the "Nutrition Facts":  Decide how many servings you will have.  Multiply the number of servings by the number of carbohydrates per serving.  The resulting number is the total amount of carbohydrates that you will be having. Learning standard serving sizes of other foods When you eat carbohydrate foods that are not packaged or do not include "Nutrition Facts" on the label, you need to measure the servings in order to count the  amount of carbohydrates:  Measure the foods that you will eat with a food scale or measuring cup, if needed.  Decide how many standard-size servings you will eat.  Multiply the number of servings by 15. Most carbohydrate-rich foods have about 15 g of carbohydrates per serving. ? For example, if you eat 8 oz (170 g) of strawberries, you will have eaten 2 servings and 30 g of carbohydrates (2 servings x 15 g = 30 g).  For foods that have more than one food mixed, such as soups and casseroles, you must count the carbohydrates in each food that is included. The following list contains standard serving sizes of common carbohydrate-rich foods. Each of these servings has about 15 g of carbohydrates:   hamburger bun or  English muffin.   oz (15 mL) syrup.   oz (14 g) jelly.  1 slice of bread.  1 six-inch tortilla.  3 oz (85 g) cooked rice or pasta.  4 oz (113 g) cooked dried beans.  4 oz (113 g) starchy vegetable, such as peas, corn, or potatoes.  4 oz (113 g) hot cereal.  4 oz (113 g) mashed potatoes or  of a large baked potato.  4 oz (113 g) canned or frozen fruit.  4 oz (120 mL) fruit juice.  4-6 crackers.  6 chicken nuggets.  6 oz (170 g) unsweetened dry cereal.  6 oz (170 g) plain fat-free yogurt or yogurt sweetened with artificial sweeteners.  8 oz (240 mL) milk.  8 oz (170 g) fresh fruit or one small piece of fruit.  24 oz (  680 g) popped popcorn. Example of carbohydrate counting Sample meal  3 oz (85 g) chicken breast.  6 oz (170 g) brown rice.  4 oz (113 g) corn.  8 oz (240 mL) milk.  8 oz (170 g) strawberries with sugar-free whipped topping. Carbohydrate calculation 1. Identify the foods that contain carbohydrates: ? Rice. ? Corn. ? Milk. ? Strawberries. 2. Calculate how many servings you have of each food: ? 2 servings rice. ? 1 serving corn. ? 1 serving milk. ? 1 serving strawberries. 3. Multiply each number of servings by 15 g: ? 2  servings rice x 15 g = 30 g. ? 1 serving corn x 15 g = 15 g. ? 1 serving milk x 15 g = 15 g. ? 1 serving strawberries x 15 g = 15 g. 4. Add together all of the amounts to find the total grams of carbohydrates eaten: ? 30 g + 15 g + 15 g + 15 g = 75 g of carbohydrates total. Summary  Carbohydrate counting is a method of keeping track of how many carbohydrates you eat.  Eating carbohydrates naturally increases the amount of sugar (glucose) in the blood.  Counting how many carbohydrates you eat helps keep your blood glucose within normal limits, which helps you manage your diabetes.  A diet and nutrition specialist (registered dietitian) can help you make a meal plan and calculate how many carbohydrates you should have at each meal and snack. This information is not intended to replace advice given to you by your health care provider. Make sure you discuss any questions you have with your health care provider. Document Released: 05/21/2005 Document Revised: 12/13/2016 Document Reviewed: 11/02/2015 Elsevier Patient Education  2020 [employer].   Health Maintenance, Female Adopting a healthy lifestyle and getting preventive care are important in promoting health and wellness. Ask your health care provider about:  The right schedule for you to have regular tests and exams.  Things you can do on your own to prevent diseases and keep yourself healthy. What should I know about diet, weight, and exercise? Eat a healthy diet   Eat a diet that includes plenty of vegetables, fruits, low-fat dairy products, and lean protein.  Do not eat a lot of foods that are high in solid fats, added sugars, or sodium. Maintain a healthy weight Body mass index (BMI) is used to identify weight problems. It estimates body fat based on height and weight. Your health care provider can help determine your BMI and help you achieve or maintain a healthy weight. Get regular exercise Get regular exercise. This is one  of the most important things you can do for your health. Most adults should:  Exercise for at least 150 minutes each week. The exercise should increase your heart rate and make you sweat (moderate-intensity exercise).  Do strengthening exercises at least twice a week. This is in addition to the moderate-intensity exercise.  Spend less time sitting. Even light physical activity can be beneficial. Watch cholesterol and blood lipids Have your blood tested for lipids and cholesterol at 51 years of age, then have this test every 5 years. Have your cholesterol levels checked more often if:  Your lipid or cholesterol levels are high.  You are older than 51 years of age.  You are at high risk for heart disease. What should I know about cancer screening? Depending on your health history and family history, you may need to have cancer screening at various ages. This may include screening  for:  Breast cancer.  Cervical cancer.  Colorectal cancer.  Skin cancer.  Lung cancer. What should I know about heart disease, diabetes, and high blood pressure? Blood pressure and heart disease  High blood pressure causes heart disease and increases the risk of stroke. This is more likely to develop in people who have high blood pressure readings, are of African descent, or are overweight.  Have your blood pressure checked: ? Every 3-5 years if you are 14-56 years of age. ? Every year if you are 8 years old or older. Diabetes Have regular diabetes screenings. This checks your fasting blood sugar level. Have the screening done:  Once every three years after age 14 if you are at a normal weight and have a low risk for diabetes.  More often and at a younger age if you are overweight or have a high risk for diabetes. What should I know about preventing infection? Hepatitis B If you have a higher risk for hepatitis B, you should be screened for this virus. Talk with your health care provider to find  out if you are at risk for hepatitis B infection. Hepatitis C Testing is recommended for:  Everyone born from [birth year] through 1963.  Anyone with known risk factors for hepatitis C. Sexually transmitted infections (STIs)  Get screened for STIs, including gonorrhea and chlamydia, if: ? You are sexually active and are younger than 51 years of age. ? You are older than 51 years of age and your health care provider tells you that you are at risk for this type of infection. ? Your sexual activity has changed since you were last screened, and you are at increased risk for chlamydia or gonorrhea. Ask your health care provider if you are at risk.  Ask your health care provider about whether you are at high risk for HIV. Your health care provider may recommend a prescription medicine to help prevent HIV infection. If you choose to take medicine to prevent HIV, you should first get tested for HIV. You should then be tested every 3 months for as long as you are taking the medicine. Pregnancy  If you are about to stop having your period (premenopausal) and you may become pregnant, seek counseling before you get pregnant.  Take 400 to 800 micrograms (mcg) of folic acid every day if you become pregnant.  Ask for birth control (contraception) if you want to prevent pregnancy. Osteoporosis and menopause Osteoporosis is a disease in which the bones lose minerals and strength with aging. This can result in bone fractures. If you are 25 years old or older, or if you are at risk for osteoporosis and fractures, ask your health care provider if you should:  Be screened for bone loss.  Take a calcium or vitamin D supplement to lower your risk of fractures.  Be given hormone replacement therapy (HRT) to treat symptoms of menopause. Follow these instructions at home: Lifestyle  Do not use any products that contain nicotine or tobacco, such as cigarettes, e-cigarettes, and chewing tobacco. If you need help  quitting, ask your health care provider.  Do not use street drugs.  Do not share needles.  Ask your health care provider for help if you need support or information about quitting drugs. Alcohol use  Do not drink alcohol if: ? Your health care provider tells you not to drink. ? You are pregnant, may be pregnant, or are planning to become pregnant.  If you drink alcohol: ? Limit how much you  use to 0-1 drink a day. ? Limit intake if you are breastfeeding.  Be aware of how much alcohol is in your drink. In the [place] one drink equals one 12 oz bottle of beer (355 mL), one 5 oz glass of wine (148 mL), or one 1 oz glass of hard liquor (44 mL). General instructions  Schedule regular health, dental, and eye exams.  Stay current with your vaccines.  Tell your health care provider if: ? You often feel depressed. ? You have ever been abused or do not feel safe at home. Summary  Adopting a healthy lifestyle and getting preventive care are important in promoting health and wellness.  Follow your health care provider's instructions about healthy diet, exercising, and getting tested or screened for diseases.  Follow your health care provider's instructions on monitoring your cholesterol and blood pressure. This information is not intended to replace advice given to you by your health care provider. Make sure you discuss any questions you have with your health care provider. Document Released: 12/04/2010 Document Revised: 05/14/2018 Document Reviewed: 05/14/2018 Elsevier Patient Education  2020 [employer].

## 2019-04-22 NOTE — Progress Notes (Signed)
Andrea Schroeder [birth year] [id number]    History:    Presents for annual exam.  Amenorrheic x1 year tolerating hot flushes.  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  2019 hemoglobin A1c 5.9 has been trying to do a lower carb/calorie diet.  Normal mammogram history.  Not sexually active, denies need for STD screen.  History of anxiety and depression on no medication.  Past medical history, past surgical history, family history and social history were all reviewed and documented in the EPIC chart.  Works for [employer] has been traveling a lot for work less this year due to [employer].  Both parents deceased in their 13s from lung cancer both were smokers.  ROS:  A ROS was performed and pertinent positives and negatives are included.  Exam:  Vitals:   04/22/19 1601  BP: 118/80  Weight: 170 lb (77.1 kg)  Height: 5\' 5"  (1.651 m)   Body mass index is 28.29 kg/m.   General appearance:  Normal Thyroid:  Symmetrical, normal in size, without palpable masses or nodularity. Respiratory  Auscultation:  Clear without wheezing or rhonchi Cardiovascular  Auscultation:  Regular rate, without rubs, murmurs or gallops  Edema/varicosities:  Not grossly evident Abdominal  Soft,nontender, without masses, guarding or rebound.  Liver/spleen:  No organomegaly noted  Hernia:  None appreciated  Skin  Inspection:  Grossly normal   Breasts: Examined lying and sitting. History of breast reduction    Right: Without masses, retractions, discharge or axillary adenopathy.     Left: Without masses, retractions, discharge or axillary adenopathy. Gentitourinary   Inguinal/mons:  Normal without inguinal adenopathy  External genitalia:  Normal  BUS/Urethra/Skene's glands:  Normal  Vagina:  Normal  Cervix:  Normal  Uterus:  normal in size, shape and contour.  Midline and mobile  Adnexa/parametria:     Rt: Without masses or tenderness.   Lt: Without masses or tenderness.  Anus and perineum: Normal  Digital rectal  exam: Normal sphincter tone without palpated masses or tenderness  Assessment/Plan:  53 y.o. D WF G0 for annual exam with no complaints.  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  Instructed to call if any further bleeding.  Aware of need to continue increasing regular exercise, decreasing calorie/carbs.  Will have labs drawn at health screening at work and have lab report sent to our office.  Screening colonoscopy discussed [facility] information given instructed to schedule.  Pap normal 2018 with negative high-risk HPV new screening guidelines reviewed.    Huel Cote [facility], 5:06 PM 04/22/2019

## 2019-06-01 ENCOUNTER — Encounter: Payer: Self-pay | Admitting: Women's Health

## 2020-04-26 ENCOUNTER — Ambulatory Visit: Payer: 59 | Admitting: Nurse Practitioner

## 2020-04-26 ENCOUNTER — Encounter: Payer: Self-pay | Admitting: Nurse Practitioner

## 2020-04-26 ENCOUNTER — Encounter: Payer: 59 | Admitting: Nurse Practitioner

## 2020-04-26 ENCOUNTER — Other Ambulatory Visit: Payer: Self-pay

## 2020-04-26 VITALS — BP 110/80 | Ht 65.0 in | Wt 156.0 lb

## 2020-04-26 DIAGNOSIS — Z01419 Encounter for gynecological examination (general) (routine) without abnormal findings: Secondary | ICD-10-CM

## 2020-04-26 DIAGNOSIS — R7309 Other abnormal glucose: Secondary | ICD-10-CM

## 2020-04-26 LAB — CBC WITH DIFFERENTIAL/PLATELET
Lymphs Abs: 3016 cells/uL (ref 850–3900)
MCH: 29.5 pg (ref 27.0–33.0)
MCHC: 33.4 g/dL (ref 32.0–36.0)
Neutro Abs: 4351 cells/uL (ref 1500–7800)

## 2020-04-26 NOTE — Addendum Note (Signed)
Addended by: Tito Dine on: 04/26/2020 04:35 PM   Modules accepted: Orders

## 2020-04-26 NOTE — Patient Instructions (Signed)

## 2020-04-26 NOTE — Progress Notes (Signed)
   Andrea Schroeder 1965/07/11 [id number]   History:  54 y.o. G0 presents for annual exam without GYN complaints. Postmenopausal - no HRT, no bleeding. 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 Normal mammogram history.   Gynecologic History Patient's last menstrual period was 04/21/2018.   Contraception: post menopausal status Last Pap: 02/2017. Results were: normal Last mammogram: 05/2019. Results were: normal Last colonoscopy: 07/2019. Results were: polyps, 5 year recall Last Dexa: Never  Past medical history, past surgical history, family history and social history were all reviewed and documented in the EPIC chart.  ROS:  A ROS was performed and pertinent positives and negatives are included.  Exam:  Vitals:   04/26/20 1609  BP: 110/80  Weight: 156 lb (70.8 kg)  Height: 5\' 5"  (1.651 m)   Body mass index is 25.96 kg/m.  General appearance:  Normal Thyroid:  Symmetrical, normal in size, without palpable masses or nodularity. Respiratory  Auscultation:  Clear without wheezing or rhonchi Cardiovascular  Auscultation:  Regular rate, without rubs, murmurs or gallops  Edema/varicosities:  Not grossly evident Abdominal  Soft,nontender, without masses, guarding or rebound.  Liver/spleen:  No organomegaly noted  Hernia:  None appreciated  Skin  Inspection:  Grossly normal   Breasts: Examined lying and sitting.   Right: Without masses, retractions, discharge or axillary adenopathy.   Left: Without masses, retractions, discharge or axillary adenopathy. Gentitourinary   Inguinal/mons:  Normal without inguinal adenopathy  External genitalia:  Normal  BUS/Urethra/Skene's glands:  Normal  Vagina:  Normal  Cervix:  Normal  Uterus:  Normal in size, shape and contour.  Midline and mobile  Adnexa/parametria:     Rt: Without masses or tenderness.   Lt: Without masses or tenderness.  Anus and perineum: Normal  Digital rectal exam: Normal sphincter tone without palpated masses or  tenderness  Assessment/Plan:  54 y.o. G0 for annual exam.   Well female exam with routine gynecological exam - Plan: CBC with Differential/Platelet, Comprehensive metabolic panel. Education provided on SBEs, importance of preventative screenings, current guidelines, high calcium diet, regular exercise, and multivitamin daily.   Elevated hemoglobin A1c - Plan: Hemoglobin A1c  Screening for cervical cancer - 1982 CIN-1, subsequent paps normal. Requests pap today. Pap with reflex done.   Screening for breast cancer - Normal mammogram history. Continue annual screenings. Normal breast exam today.   Screening for colon cancer - up to date. Will follow up for screening colonoscopy at GI's recommended interval.  Follow up in 1 year for annual.      [facility], 4:28 PM 04/26/2020

## 2020-04-27 LAB — CBC WITH DIFFERENTIAL/PLATELET
Absolute Monocytes: 764 cells/uL (ref 200–950)
Basophils Absolute: 59 cells/uL (ref 0–200)
Basophils Relative: 0.7 %
Eosinophils Absolute: 210 cells/uL (ref 15–500)
Eosinophils Relative: 2.5 %
HCT: 38.3 % (ref 35.0–45.0)
Hemoglobin: 12.8 g/dL (ref 11.7–15.5)
MCV: 88.2 fL (ref 80.0–100.0)
MPV: 10.7 fL (ref 7.5–12.5)
Monocytes Relative: 9.1 %
Neutrophils Relative %: 51.8 %
Platelets: 484 10*3/uL — ABNORMAL HIGH (ref 140–400)
RBC: 4.34 10*6/uL (ref 3.80–5.10)
RDW: 13 % (ref 11.0–15.0)
Total Lymphocyte: 35.9 %
WBC: 8.4 10*3/uL (ref 3.8–10.8)

## 2020-04-27 LAB — COMPREHENSIVE METABOLIC PANEL
AG Ratio: 1.4 (calc) (ref 1.0–2.5)
ALT: 12 U/L (ref 6–29)
AST: 13 U/L (ref 10–35)
Albumin: 4 g/dL (ref 3.6–5.1)
Alkaline phosphatase (APISO): 56 U/L (ref 37–153)
BUN/Creatinine Ratio: 39 (calc) — ABNORMAL HIGH (ref 6–22)
BUN: 27 mg/dL — ABNORMAL HIGH (ref 7–25)
CO2: 26 mmol/L (ref 20–32)
Calcium: 9.6 mg/dL (ref 8.6–10.4)
Chloride: 104 mmol/L (ref 98–110)
Creat: 0.7 mg/dL (ref 0.50–1.05)
Globulin: 2.8 g/dL (calc) (ref 1.9–3.7)
Glucose, Bld: 99 mg/dL (ref 65–99)
Potassium: 4 mmol/L (ref 3.5–5.3)
Sodium: 139 mmol/L (ref 135–146)
Total Bilirubin: 0.2 mg/dL (ref 0.2–1.2)
Total Protein: 6.8 g/dL (ref 6.1–8.1)

## 2020-04-27 LAB — HEMOGLOBIN A1C
Hgb A1c MFr Bld: 5.7 % of total Hgb — ABNORMAL HIGH (ref <5.7)
Mean Plasma Glucose: 117 (calc)
eAG (mmol/L): 6.5 (calc)

## 2020-05-02 LAB — PAP IG W/ RFLX HPV ASCU

## 2021-05-02 ENCOUNTER — Other Ambulatory Visit: Payer: Self-pay

## 2021-05-02 ENCOUNTER — Encounter: Payer: Self-pay | Admitting: Nurse Practitioner

## 2021-05-02 ENCOUNTER — Ambulatory Visit (INDEPENDENT_AMBULATORY_CARE_PROVIDER_SITE_OTHER): Payer: 59 | Admitting: Nurse Practitioner

## 2021-05-02 VITALS — BP 110/70 | HR 76 | Ht 64.5 in | Wt 176.0 lb

## 2021-05-02 DIAGNOSIS — R7303 Prediabetes: Secondary | ICD-10-CM

## 2021-05-02 DIAGNOSIS — Z01419 Encounter for gynecological examination (general) (routine) without abnormal findings: Secondary | ICD-10-CM

## 2021-05-02 NOTE — Progress Notes (Signed)
   Andrea Schroeder [id number]   History:  55 y.o. G0 presents for annual exam without GYN complaints. Postmenopausal - no HRT, no bleeding. 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 Normal mammogram history.   Gynecologic History Patient's last menstrual period was 04/21/2018.   Contraception: post menopausal status Sexually active: No  Health maintenance Last Pap: 04/26/2020. Results were: Normal, 3-year repeat Last mammogram: 06/10/2020. Results were: Normal Last colonoscopy: 07/27/2019. Results were: Tubular adenoma, 5 year recall Last Dexa: Not indicated  Past medical history, past surgical history, family history and social history were all reviewed and documented in the EPIC chart.Single. 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   ROS:  A ROS was performed and pertinent positives and negatives are included.  Exam:  Vitals:   05/02/21 1532  BP: 110/70  Pulse: 76  SpO2: 98%  Weight: 176 lb (79.8 kg)  Height: 5' 4.5" (1.638 m)    Body mass index is 29.74 kg/m.  General appearance:  Normal Thyroid:  Symmetrical, normal in size, without palpable masses or nodularity. Respiratory  Auscultation:  Clear without wheezing or rhonchi Cardiovascular  Auscultation:  Regular rate, without rubs, murmurs or gallops  Edema/varicosities:  Not grossly evident Abdominal  Soft,nontender, without masses, guarding or rebound.  Liver/spleen:  No organomegaly noted  Hernia:  None appreciated  Skin  Inspection:  Grossly normal   Breasts: Examined lying and sitting.   Right: Without masses, retractions, discharge or axillary adenopathy.   Left: Without masses, retractions, discharge or axillary adenopathy. Genitourinary   Inguinal/mons:  Normal without inguinal adenopathy  External genitalia:  Normal appearing vulva with no masses, tenderness, or lesions  BUS/Urethra/Skene's glands:  Normal  Vagina:  Normal appearing with  normal color and discharge, no lesions  Cervix:  Normal appearing without discharge or lesions  Uterus:  Normal in size, shape and contour.  Midline and mobile, nontender  Adnexa/parametria:     Rt: Normal in size, without masses or tenderness.   Lt: Normal in size, without masses or tenderness.  Anus and perineum: Normal  Digital rectal exam: Normal sphincter tone without palpated masses or tenderness  Patient informed chaperone available to be present for breast and pelvic exam. Patient has requested no chaperone to be present. Patient has been advised what will be completed during breast and pelvic exam.   Assessment/Plan:  55 y.o. G0 for annual exam.   Well female exam with routine gynecological exam - Plan: CBC with Differential/Platelet, Comprehensive metabolic panel, Lipid panel. Education provided on SBEs, importance of preventative screenings, current guidelines, high calcium diet, regular exercise, and multivitamin daily.  Will return for fasting labs.   Prediabetes - Plan: Hemoglobin A1c  Screening for cervical cancer - 1982 CIN-1, subsequent paps normal. Will repeat at 3-year interval per guidelines.    Screening for breast cancer - Normal mammogram history. Continue annual screenings. Normal breast exam today.   Screening for colon cancer - 2021 colonoscopy - adenomas. Will repeat at 5-year interval per recommendation.   Follow up in 1 year for annual.      Olivia Mackie [facility], 3:59 PM 05/02/2021

## 2021-05-31 ENCOUNTER — Other Ambulatory Visit: Payer: 59

## 2021-05-31 ENCOUNTER — Other Ambulatory Visit: Payer: Self-pay

## 2021-05-31 DIAGNOSIS — R7303 Prediabetes: Secondary | ICD-10-CM

## 2021-05-31 DIAGNOSIS — Z01419 Encounter for gynecological examination (general) (routine) without abnormal findings: Secondary | ICD-10-CM

## 2021-06-01 ENCOUNTER — Encounter: Payer: Self-pay | Admitting: Nurse Practitioner

## 2021-06-01 LAB — COMPREHENSIVE METABOLIC PANEL
AG Ratio: 1.6 (calc) (ref 1.0–2.5)
ALT: 24 U/L (ref 6–29)
AST: 20 U/L (ref 10–35)
Albumin: 4.2 g/dL (ref 3.6–5.1)
Alkaline phosphatase (APISO): 52 U/L (ref 37–153)
BUN: 21 mg/dL (ref 7–25)
CO2: 28 mmol/L (ref 20–32)
Calcium: 9.5 mg/dL (ref 8.6–10.4)
Chloride: 104 mmol/L (ref 98–110)
Creat: 0.73 mg/dL (ref 0.50–1.03)
Globulin: 2.6 g/dL (calc) (ref 1.9–3.7)
Glucose, Bld: 91 mg/dL (ref 65–99)
Potassium: 4.7 mmol/L (ref 3.5–5.3)
Sodium: 139 mmol/L (ref 135–146)
Total Bilirubin: 0.3 mg/dL (ref 0.2–1.2)
Total Protein: 6.8 g/dL (ref 6.1–8.1)

## 2021-06-01 LAB — CBC WITH DIFFERENTIAL/PLATELET
Absolute Monocytes: 683 cells/uL (ref 200–950)
Basophils Absolute: 47 cells/uL (ref 0–200)
Basophils Relative: 0.7 %
Eosinophils Absolute: 208 cells/uL (ref 15–500)
Eosinophils Relative: 3.1 %
HCT: 40.5 % (ref 35.0–45.0)
Hemoglobin: 13.4 g/dL (ref 11.7–15.5)
Lymphs Abs: 2566 cells/uL (ref 850–3900)
MCH: 30 pg (ref 27.0–33.0)
MCHC: 33.1 g/dL (ref 32.0–36.0)
MCV: 90.8 fL (ref 80.0–100.0)
MPV: 10.6 fL (ref 7.5–12.5)
Monocytes Relative: 10.2 %
Neutro Abs: 3196 cells/uL (ref 1500–7800)
Neutrophils Relative %: 47.7 %
Platelets: 470 10*3/uL — ABNORMAL HIGH (ref 140–400)
RBC: 4.46 10*6/uL (ref 3.80–5.10)
RDW: 12.4 % (ref 11.0–15.0)
Total Lymphocyte: 38.3 %
WBC: 6.7 10*3/uL (ref 3.8–10.8)

## 2021-06-01 LAB — LIPID PANEL
Cholesterol: 225 mg/dL — ABNORMAL HIGH (ref <200)
HDL: 74 mg/dL (ref >=50)
LDL Cholesterol (Calc): 135 mg/dL (calc) — ABNORMAL HIGH
Non-HDL Cholesterol (Calc): 151 mg/dL (calc) — ABNORMAL HIGH (ref <130)
Total CHOL/HDL Ratio: 3 (calc) (ref <5.0)
Triglycerides: 64 mg/dL (ref <150)

## 2021-06-01 LAB — HEMOGLOBIN A1C
Hgb A1c MFr Bld: 5.8 % of total Hgb — ABNORMAL HIGH (ref <5.7)
Mean Plasma Glucose: 120 mg/dL
eAG (mmol/L): 6.6 mmol/L

## 2021-06-13 ENCOUNTER — Encounter: Payer: Self-pay | Admitting: Nurse Practitioner

## 2022-05-07 ENCOUNTER — Encounter: Payer: Self-pay | Admitting: Nurse Practitioner

## 2022-05-07 ENCOUNTER — Ambulatory Visit (INDEPENDENT_AMBULATORY_CARE_PROVIDER_SITE_OTHER): Payer: 59 | Admitting: Nurse Practitioner

## 2022-05-07 VITALS — BP 92/64 | HR 85 | Ht 64.5 in | Wt 179.0 lb

## 2022-05-07 DIAGNOSIS — Z780 Asymptomatic menopausal state: Secondary | ICD-10-CM

## 2022-05-07 DIAGNOSIS — E785 Hyperlipidemia, unspecified: Secondary | ICD-10-CM

## 2022-05-07 DIAGNOSIS — Z87898 Personal history of other specified conditions: Secondary | ICD-10-CM

## 2022-05-07 DIAGNOSIS — Z01419 Encounter for gynecological examination (general) (routine) without abnormal findings: Secondary | ICD-10-CM

## 2022-05-07 NOTE — Progress Notes (Signed)
Andrea Schroeder 23-Oct-1965 [id number]   History:  56 y.o. G0 presents for annual exam without GYN complaints. Postmenopausal - no HRT, no bleeding. 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 Normal mammogram history. H/O HLD, pre-diabetes. Was doing weight loss program over the summer and had good success but has been very busy and has not been consistent with lately. 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 Plans to follow up with ortho again.   Gynecologic History Patient's last menstrual period was 04/21/2018.   Contraception: post menopausal status Sexually active: No  Health maintenance Last Pap: 04/26/2020. Results were: Normal, 3-year repeat Last mammogram: 06/12/2021. Results were: Normal Last colonoscopy: 07/27/2019. Results were: Tubular adenoma, 5-year recall Last Dexa: Not indicated  Past medical history, past surgical history, family history and social history were all reviewed and documented in the EPIC chart.Single. 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   ROS:  A ROS was performed and pertinent positives and negatives are included.  Exam:  Vitals:   05/07/22 0757  BP: 92/64  Pulse: 85  SpO2: 97%  Weight: 179 lb (81.2 kg)  Height: 5' 4.5" (1.638 m)     Body mass index is 30.25 kg/m.  General appearance:  Normal Thyroid:  Symmetrical, normal in size, without palpable masses or nodularity. Respiratory  Auscultation:  Clear without wheezing or rhonchi Cardiovascular  Auscultation:  Regular rate, without rubs, murmurs or gallops  Edema/varicosities:  Not grossly evident Abdominal  Soft,nontender, without masses, guarding or rebound.  Liver/spleen:  No organomegaly noted  Hernia:  None appreciated  Skin  Inspection:  Grossly normal   Breasts: Examined lying and sitting.   Right: Without masses, retractions, discharge or axillary  adenopathy.   Left: Without masses, retractions, discharge or axillary adenopathy. Genitourinary   Inguinal/mons:  Normal without inguinal adenopathy  External genitalia:  Normal appearing vulva with no masses, tenderness, or lesions  BUS/Urethra/Skene's glands:  Normal  Vagina:  Normal appearing with normal color and discharge, no lesions  Cervix:  Normal appearing without discharge or lesions  Uterus:  Normal in size, shape and contour.  Midline and mobile, nontender  Adnexa/parametria:     Rt: Normal in size, without masses or tenderness.   Lt: Normal in size, without masses or tenderness.  Anus and perineum: Normal  Digital rectal exam: Normal sphincter tone without palpated masses or tenderness  Patient informed chaperone available to be present for breast and pelvic exam. Patient has requested no chaperone to be present. Patient has been advised what will be completed during breast and pelvic exam.   Assessment/Plan:  56 y.o. G0 for annual exam.   Well female exam with routine gynecological exam - Plan: Comprehensive metabolic panel, CBC with Differential/Platelet. Education provided on SBEs, importance of preventative screenings, current guidelines, high calcium diet, regular exercise, and multivitamin daily.    Postmenopausal - no HRT, no bleeding  History of prediabetes - Plan: Hemoglobin A1c  Hyperlipidemia, unspecified hyperlipidemia type - Plan: Lipid panel  Screening for cervical cancer - 1982 CIN-1, subsequent paps normal. Will repeat at 3-year interval per guidelines.    Screening for breast cancer - Normal mammogram history. Continue annual screenings. Normal breast exam today.   Screening for colon cancer - 2021 colonoscopy - adenomas. Will repeat at 5-year interval per recommendation.   Follow up in 1 year for annual.  Olivia Mackie [facility], 8:22 AM 05/07/2022

## 2022-05-08 LAB — COMPREHENSIVE METABOLIC PANEL
AG Ratio: 1.4 (calc) (ref 1.0–2.5)
ALT: 12 U/L (ref 6–29)
AST: 15 U/L (ref 10–35)
Albumin: 4.1 g/dL (ref 3.6–5.1)
Alkaline phosphatase (APISO): 58 U/L (ref 37–153)
BUN: 15 mg/dL (ref 7–25)
CO2: 27 mmol/L (ref 20–32)
Calcium: 9.6 mg/dL (ref 8.6–10.4)
Chloride: 105 mmol/L (ref 98–110)
Creat: 0.8 mg/dL (ref 0.50–1.03)
Globulin: 2.9 g/dL (calc) (ref 1.9–3.7)
Glucose, Bld: 96 mg/dL (ref 65–99)
Potassium: 4.4 mmol/L (ref 3.5–5.3)
Sodium: 140 mmol/L (ref 135–146)
Total Bilirubin: 0.3 mg/dL (ref 0.2–1.2)
Total Protein: 7 g/dL (ref 6.1–8.1)

## 2022-05-08 LAB — CBC WITH DIFFERENTIAL/PLATELET
Absolute Monocytes: 654 cells/uL (ref 200–950)
Basophils Absolute: 72 cells/uL (ref 0–200)
Basophils Relative: 1.2 %
Eosinophils Absolute: 180 cells/uL (ref 15–500)
Eosinophils Relative: 3 %
HCT: 37.4 % (ref 35.0–45.0)
Hemoglobin: 12.5 g/dL (ref 11.7–15.5)
Lymphs Abs: 1914 cells/uL (ref 850–3900)
MCH: 30 pg (ref 27.0–33.0)
MCHC: 33.4 g/dL (ref 32.0–36.0)
MCV: 89.7 fL (ref 80.0–100.0)
MPV: 10.6 fL (ref 7.5–12.5)
Monocytes Relative: 10.9 %
Neutro Abs: 3180 cells/uL (ref 1500–7800)
Neutrophils Relative %: 53 %
Platelets: 471 10*3/uL — ABNORMAL HIGH (ref 140–400)
RBC: 4.17 10*6/uL (ref 3.80–5.10)
RDW: 13 % (ref 11.0–15.0)
Total Lymphocyte: 31.9 %
WBC: 6 10*3/uL (ref 3.8–10.8)

## 2022-05-08 LAB — LIPID PANEL
Cholesterol: 224 mg/dL — ABNORMAL HIGH (ref <200)
HDL: 71 mg/dL (ref >=50)
LDL Cholesterol (Calc): 131 mg/dL (calc) — ABNORMAL HIGH
Non-HDL Cholesterol (Calc): 153 mg/dL (calc) — ABNORMAL HIGH (ref <130)
Total CHOL/HDL Ratio: 3.2 (calc) (ref <5.0)
Triglycerides: 109 mg/dL (ref <150)

## 2022-05-08 LAB — HEMOGLOBIN A1C
Hgb A1c MFr Bld: 6 % of total Hgb — ABNORMAL HIGH (ref <5.7)
Mean Plasma Glucose: 126 mg/dL
eAG (mmol/L): 7 mmol/L

## 2022-06-15 ENCOUNTER — Encounter: Payer: Self-pay | Admitting: Nurse Practitioner

## 2022-11-05 ENCOUNTER — Ambulatory Visit (INDEPENDENT_AMBULATORY_CARE_PROVIDER_SITE_OTHER): Payer: 59

## 2022-11-05 ENCOUNTER — Ambulatory Visit: Payer: 59 | Admitting: Podiatry

## 2022-11-05 DIAGNOSIS — M7750 Other enthesopathy of unspecified foot: Secondary | ICD-10-CM

## 2022-11-05 DIAGNOSIS — Q66221 Congenital metatarsus adductus, right foot: Secondary | ICD-10-CM

## 2022-11-05 DIAGNOSIS — Q66222 Congenital metatarsus adductus, left foot: Secondary | ICD-10-CM

## 2022-11-05 DIAGNOSIS — M79671 Pain in right foot: Secondary | ICD-10-CM

## 2022-11-05 DIAGNOSIS — M778 Other enthesopathies, not elsewhere classified: Secondary | ICD-10-CM

## 2022-11-05 MED ORDER — MELOXICAM 15 MG PO TABS: 15.0000 mg | ORAL_TABLET | Freq: Every day | ORAL | 0 refills | Status: AC | PRN

## 2022-11-05 NOTE — Patient Instructions (Signed)

## 2022-11-05 NOTE — Progress Notes (Unsigned)
Subjective:   Patient ID: Andrea Schroeder, female   DOB: 57 y.o.   MRN: [id number]   HPI Chief Complaint  Patient presents with   Foot Pain    Bilateral foot pain worse when she first gets up but will get better through the day    57 year old female presents the office for above concerns.  She states she gets pain in both of her feet when she first gets up and gets better throughout the day.  She had ankle discomfort last to 3 weeks.  Left side worse than right.  No injuries.  No recent treatment.  No other concerns.  Review of Systems  All other systems reviewed and are negative.  Past Medical History:  Diagnosis Date   Interrupted aortic arch    AORTIC ARCH REVERSED-NO PROBLEMS   Prediabetes 2015    Past Surgical History:  Procedure Laterality Date   SPLEENECTOMY  1985   MONO   WISDOM TOOTH EXTRACTION       Current Outpatient Medications:    meloxicam (MOBIC) 15 MG tablet, Take 1 tablet (15 mg total) by mouth daily as needed for pain., Disp: 30 tablet, Rfl: 0   Ascorbic Acid (VITAMIN C) 1000 MG tablet, Take 1,000 mg by mouth daily., Disp: , Rfl:    B Complex Vitamins (VITAMIN B COMPLEX PO), Take 1 tablet by mouth daily., Disp: , Rfl:    Cholecalciferol (VITAMIN D PO), Take by mouth., Disp: , Rfl:    fluticasone (FLONASE) 50 MCG/ACT nasal spray, as needed., Disp: , Rfl:    SUMAtriptan (IMITREX) 100 MG tablet, TAKE 1 TABLET(100 MG) BY MOUTH DAILY FOR UP TO 9 DOSES AS NEEDED FOR MIGRAINE. MAXIMUM 2 TABLETS IN A DAY, Disp: , Rfl:   Allergies  Allergen Reactions   Amoxicillin-Pot Clavulanate Itching and Swelling   Triamcinolone Acetonide Itching and Swelling           Objective:  Physical Exam  General: AAO x3, NAD  Dermatological: Skin is warm, dry and supple bilateral. There are no open sores, no preulcerative lesions, no rash or signs of infection present.  Vascular: Dorsalis Pedis artery and Posterior Tibial artery pedal pulses are 2/4 bilateral with immedate  capillary fill time. There is no pain with calf compression, swelling, warmth, erythema.   Neruologic: Grossly intact via light touch bilateral.   Musculoskeletal: Also there is tenderness palpation of the course of the peroneal tendon clinically significant intact, able to appreciate any area of pinpoint tenderness. Extensor tendons appear to be intact.  MMT 5/5.  Equinus present left side worse than right.  Bunion present.  Gait: Unassisted, Nonantalgic.       Assessment:   57 year old female with ankle tendonitis; metatarsus adductus, bunion.     Plan:  -Treatment options discussed including all alternatives, risks, and complications -Etiology of symptoms were discussed -X-rays were obtained and reviewed with the patient.  3 views of the feet and 2 views of the ankle obtained bilaterally.  Ankle joint space maintained.  Severe bunions present metatarsal adductus.  No evidence of acute fracture. -Prescribed mobic. Discussed side effects of the medication and directed to stop if any are to occur and call the office.  -Discussed stretching, icing daily.  Discussed  shoe modifications and inserts.  We discussed custom orthotics and she will consider this.  Vivi Barrack DPM

## 2022-12-25 ENCOUNTER — Encounter: Payer: Self-pay | Admitting: Podiatry

## 2022-12-25 ENCOUNTER — Telehealth: Payer: Self-pay | Admitting: Podiatry

## 2022-12-25 NOTE — Telephone Encounter (Signed)
pt never got the exerises she was told that she was going to get ,pt still having pain in foot an ankle.

## 2023-01-17 ENCOUNTER — Telehealth: Payer: Self-pay

## 2023-01-17 NOTE — Telephone Encounter (Signed)
TW pt LVM in triage line stating that she had an area of concern in her breast so contacted [facility] for an appt and is requesting an order be sent/signed.  Wasn't able to locate order yet. Msg sent to CS to lookout for it.   Returned call to pt, no answer, LDVM on machine per DPR and advised her that we require an office visit for breast exam for clinical documentation on concern/exam for specific area of concern prior to sending/signing orders to diagnostic breast imaging. Advised her to cb and schedule appt at her earliest convenience.  FYI. Screening mammo done 06/13/2022-WNL.

## 2023-01-21 NOTE — Telephone Encounter (Signed)
Pt scheduled for breast check on 01/28/2023. Routing to provider for final review and closing encounter.

## 2023-01-28 ENCOUNTER — Encounter: Payer: Self-pay | Admitting: Nurse Practitioner

## 2023-01-28 ENCOUNTER — Ambulatory Visit: Payer: 59 | Admitting: Nurse Practitioner

## 2023-01-28 VITALS — BP 104/62 | HR 78

## 2023-01-28 DIAGNOSIS — N644 Mastodynia: Secondary | ICD-10-CM

## 2023-01-28 NOTE — Progress Notes (Signed)
   Acute Office Visit  Subjective:    Patient ID: Andrea Schroeder, female    DOB: [birth year], 57 y.o.   MRN: [id number]   HPI 57 y.o. presents today for right breast check. Noticed a tender lump in right lower breast about a week ago. Does not really feel it today. Mammogram 06/13/2022 normal. Normal mammogram history. No family history of breast cancer.   Patient's last menstrual period was 04/21/2018.    Review of Systems  Constitutional: Negative.   Hematological:  Negative for adenopathy.  Right breast: Positive for mass and pain. Negative for nipple discharge, redness, swelling, or skin changes     Objective:    Physical Exam Constitutional:      Appearance: Normal appearance.  Chest:  Breasts:    Right: Tenderness present. No swelling, bleeding, inverted nipple, mass, nipple discharge or skin change.       BP 104/62   Pulse 78   LMP 04/21/2018   SpO2 99%  Wt Readings from Last 3 Encounters:  05/07/22 179 lb (81.2 kg)  05/02/21 176 lb (79.8 kg)  04/26/20 156 lb (70.8 kg)        Patient informed chaperone available to be present for breast and/or pelvic exam. Patient has requested no chaperone to be present. Patient has been advised what will be completed during breast and pelvic exam.   Assessment & Plan:   Problem List Items Addressed This Visit   None Visit Diagnoses     Pain of right breast    -  Primary      Plan: No definitive mass felt in right breast. Some tenderness in area of concern with exam. Already has appointment scheduled for tomorrow at [facility]. Order faxed.      Olivia Mackie DNP, 9:33 AM 01/28/2023

## 2023-01-30 ENCOUNTER — Encounter: Payer: Self-pay | Admitting: Nurse Practitioner

## 2023-06-19 ENCOUNTER — Encounter: Payer: Self-pay | Admitting: Nurse Practitioner

## 2023-07-08 ENCOUNTER — Other Ambulatory Visit (HOSPITAL_COMMUNITY)
Admission: RE | Admit: 2023-07-08 | Discharge: 2023-07-08 | Disposition: A | Discharge status: Home / Self Care | Payer: 59 | Source: Ambulatory Visit | Attending: Nurse Practitioner | Admitting: Nurse Practitioner

## 2023-07-08 ENCOUNTER — Encounter: Payer: Self-pay | Admitting: Nurse Practitioner

## 2023-07-08 ENCOUNTER — Ambulatory Visit (INDEPENDENT_AMBULATORY_CARE_PROVIDER_SITE_OTHER): Payer: 59 | Admitting: Nurse Practitioner

## 2023-07-08 VITALS — BP 116/78 | HR 78 | Ht 64.17 in | Wt 181.0 lb

## 2023-07-08 DIAGNOSIS — Z780 Asymptomatic menopausal state: Secondary | ICD-10-CM

## 2023-07-08 DIAGNOSIS — Z124 Encounter for screening for malignant neoplasm of cervix: Secondary | ICD-10-CM | POA: Diagnosis present

## 2023-07-08 DIAGNOSIS — M810 Age-related osteoporosis without current pathological fracture: Secondary | ICD-10-CM

## 2023-07-08 DIAGNOSIS — Z01419 Encounter for gynecological examination (general) (routine) without abnormal findings: Secondary | ICD-10-CM | POA: Diagnosis not present

## 2023-07-08 DIAGNOSIS — Z7689 Persons encountering health services in other specified circumstances: Secondary | ICD-10-CM

## 2023-07-08 NOTE — Progress Notes (Signed)
Andrea Schroeder March 04, 1966 [id number]   History:  58 y.o. G0 presents for annual exam. Postmenopausal - no HRT, no bleeding. 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 Normal mammogram history. H/O HLD, pre-diabetes. Has been discouraged with her weight. Very active with pilates and yoga, just added in walking. Consumes low carb diet, has increased protein. Feels her best but not happy with weight and how clothes are fitting. Utilizes program through work for nutritional support. Diagnosed with osteoporosis in October by PCP. Planning to start supplement.   Gynecologic History Patient's last menstrual period was 04/21/2018.   Contraception: post menopausal status Sexually active: No  Health maintenance Last Pap: 04/26/2020. Results were: Normal Last mammogram: 06/18/2023. Results were: Normal Last colonoscopy: 07/27/2019. Results were: Tubular adenoma, 5-year recall Last Dexa: 03/25/2023. Results were: T-score -3.0 Exercising: Yes. Walk, yoga, pilates  Smoker: no   Past medical history, past surgical history, family history and social history were all reviewed and documented in the EPIC chart. Divorced. 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   ROS:  A ROS was performed and pertinent positives and negatives are included.  Exam:  Vitals:   07/08/23 0857  BP: 116/78  Pulse: 78  SpO2: 98%  Weight: 181 lb (82.1 kg)  Height: 5' 4.17" (1.63 m)      Body mass index is 30.9 kg/m.  General appearance:  Normal Thyroid:  Symmetrical, normal in size, without palpable masses or nodularity. Respiratory  Auscultation:  Clear without wheezing or rhonchi Cardiovascular  Auscultation:  Regular rate, without rubs, murmurs or gallops  Edema/varicosities:  Not grossly evident Abdominal  Soft,nontender, without masses, guarding or rebound.  Liver/spleen:  No organomegaly noted  Hernia:  None appreciated  Skin  Inspection:  Grossly  normal   Breasts: Examined lying and sitting.   Right: Without masses, retractions, discharge or axillary adenopathy.   Left: Without masses, retractions, discharge or axillary adenopathy. Pelvic: External genitalia:  no lesions              Urethra:  normal appearing urethra with no masses, tenderness or lesions              Bartholins and Skenes: normal                 Vagina: normal appearing vagina with normal color and discharge, no lesions              Cervix: no lesions Bimanual Exam:  Uterus:  no masses or tenderness              Adnexa: no mass, fullness, tenderness              Rectovaginal: Deferred              Anus:  normal, no lesions  Patient informed chaperone available to be present for breast and pelvic exam. Patient has requested no chaperone to be present. Patient has been advised what will be completed during breast and pelvic exam.   Assessment/Plan:  58 y.o. G0 for annual exam.   Well female exam with routine gynecological exam - Education provided on SBEs, importance of preventative screenings, current guidelines, high calcium diet, regular exercise, and multivitamin daily. Labs with PCP.   Postmenopausal - no HRT, no bleeding  Cervical cancer screening - Plan: Cytology - PAP( [place]). 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  Age-related osteoporosis without current pathological fracture - managed by PCP. T-score -3.0. Planning to optimize  supplements and exercise.   Encounter for weight management - Recommend incorporating weight lifting 3-5 days per week in conjunction with cardio. High protein/low calorie diet. Utilizes program through work for nutritional support.   Screening for breast cancer - Normal mammogram history. Continue annual screenings. Normal breast exam today.   Screening for colon cancer - 2021 colonoscopy - adenomas. Will repeat at 5-year interval per recommendation.   Return in about 1 year (around 07/07/2024) for Annual.     Olivia Mackie [facility], 9:58 AM 07/08/2023

## 2023-07-10 LAB — CYTOLOGY - PAP
Comment: NEGATIVE
Diagnosis: NEGATIVE
High risk HPV: NEGATIVE

## 2023-07-11 ENCOUNTER — Encounter: Payer: Self-pay | Admitting: Nurse Practitioner
